# Patient Record
Sex: Female | Born: 1960 | Race: White | Hispanic: No | Marital: Married | Smoking: Never smoker
Health system: Southern US, Community
[De-identification: ages and names within clinical notes are randomized; demographics above are authoritative.]

## PROBLEM LIST (undated history)

## (undated) DIAGNOSIS — J45909 Unspecified asthma, uncomplicated: Secondary | ICD-10-CM

## (undated) DIAGNOSIS — I719 Aortic aneurysm of unspecified site, without rupture: Secondary | ICD-10-CM

## (undated) DIAGNOSIS — G510 Bell's palsy: Secondary | ICD-10-CM

## (undated) DIAGNOSIS — I10 Essential (primary) hypertension: Secondary | ICD-10-CM

## (undated) DIAGNOSIS — R519 Headache, unspecified: Secondary | ICD-10-CM

## (undated) DIAGNOSIS — M25551 Pain in right hip: Secondary | ICD-10-CM

## (undated) DIAGNOSIS — E119 Type 2 diabetes mellitus without complications: Secondary | ICD-10-CM

## (undated) HISTORY — PX: OTHER SURGICAL HISTORY: SHX169

## (undated) HISTORY — PX: ANKLE ARTHROSCOPY: SHX545

## (undated) HISTORY — PX: KNEE ARTHROSCOPY: SHX127

## (undated) HISTORY — PX: ACHILLES TENDON SURGERY: SHX542

## (undated) HISTORY — PX: ABDOMINAL HYSTERECTOMY: SHX81

## (undated) HISTORY — PX: TONSILLECTOMY: SUR1361

## (undated) HISTORY — PX: UPPER GI ENDOSCOPY: SHX6162

---

## 1999-02-23 ENCOUNTER — Ambulatory Visit (HOSPITAL_COMMUNITY): Admission: RE | Admit: 1999-02-23 | Discharge: 1999-02-23 | Payer: Self-pay | Admitting: Oncology

## 2005-09-15 ENCOUNTER — Ambulatory Visit: Payer: Self-pay | Admitting: Family Medicine

## 2006-12-19 ENCOUNTER — Ambulatory Visit: Payer: Self-pay | Admitting: Family Medicine

## 2008-02-04 ENCOUNTER — Ambulatory Visit: Payer: Self-pay | Admitting: Family Medicine

## 2008-04-27 ENCOUNTER — Ambulatory Visit: Payer: Self-pay | Admitting: Orthopaedic Surgery

## 2008-06-03 ENCOUNTER — Other Ambulatory Visit: Payer: Self-pay

## 2008-06-03 ENCOUNTER — Ambulatory Visit: Payer: Self-pay | Admitting: Orthopaedic Surgery

## 2008-06-09 ENCOUNTER — Ambulatory Visit: Payer: Self-pay | Admitting: Orthopaedic Surgery

## 2009-01-27 ENCOUNTER — Ambulatory Visit: Payer: Self-pay | Admitting: Orthopaedic Surgery

## 2009-02-24 ENCOUNTER — Ambulatory Visit: Payer: Self-pay | Admitting: Family Medicine

## 2009-03-02 ENCOUNTER — Ambulatory Visit: Payer: Self-pay | Admitting: Family Medicine

## 2009-03-15 ENCOUNTER — Encounter: Admission: RE | Admit: 2009-03-15 | Discharge: 2009-03-15 | Payer: Self-pay | Admitting: Family Medicine

## 2010-03-01 ENCOUNTER — Encounter: Admission: RE | Admit: 2010-03-01 | Discharge: 2010-03-01 | Payer: Self-pay | Admitting: Family Medicine

## 2010-11-11 ENCOUNTER — Ambulatory Visit: Payer: Self-pay

## 2010-11-22 ENCOUNTER — Ambulatory Visit: Payer: Self-pay

## 2011-03-24 ENCOUNTER — Other Ambulatory Visit: Payer: Self-pay | Admitting: Family Medicine

## 2011-03-24 DIAGNOSIS — Z1231 Encounter for screening mammogram for malignant neoplasm of breast: Secondary | ICD-10-CM

## 2011-03-28 ENCOUNTER — Ambulatory Visit: Payer: Self-pay

## 2011-03-31 ENCOUNTER — Ambulatory Visit
Admission: RE | Admit: 2011-03-31 | Discharge: 2011-03-31 | Disposition: A | Discharge status: Home / Self Care | Payer: PRIVATE HEALTH INSURANCE | Source: Ambulatory Visit | Attending: Family Medicine | Admitting: Family Medicine

## 2011-03-31 DIAGNOSIS — Z1231 Encounter for screening mammogram for malignant neoplasm of breast: Secondary | ICD-10-CM

## 2011-08-21 ENCOUNTER — Ambulatory Visit: Payer: Self-pay | Admitting: Gastroenterology

## 2012-02-20 ENCOUNTER — Other Ambulatory Visit: Payer: Self-pay | Admitting: Family Medicine

## 2012-02-20 DIAGNOSIS — Z1231 Encounter for screening mammogram for malignant neoplasm of breast: Secondary | ICD-10-CM

## 2012-03-26 ENCOUNTER — Ambulatory Visit: Payer: Self-pay | Admitting: Unknown Physician Specialty

## 2012-03-27 ENCOUNTER — Ambulatory Visit: Payer: Self-pay | Admitting: Unknown Physician Specialty

## 2012-04-01 ENCOUNTER — Ambulatory Visit: Payer: PRIVATE HEALTH INSURANCE

## 2012-04-12 ENCOUNTER — Ambulatory Visit
Admission: RE | Admit: 2012-04-12 | Discharge: 2012-04-12 | Disposition: A | Discharge status: Home / Self Care | Payer: BC Managed Care – PPO | Source: Ambulatory Visit | Attending: Family Medicine | Admitting: Family Medicine

## 2012-04-12 DIAGNOSIS — Z1231 Encounter for screening mammogram for malignant neoplasm of breast: Secondary | ICD-10-CM

## 2012-04-27 ENCOUNTER — Ambulatory Visit: Payer: Self-pay | Admitting: Unknown Physician Specialty

## 2013-02-10 ENCOUNTER — Ambulatory Visit: Payer: Self-pay | Admitting: Orthopaedic Surgery

## 2013-03-10 ENCOUNTER — Ambulatory Visit: Payer: Self-pay | Admitting: Gastroenterology

## 2013-03-24 ENCOUNTER — Ambulatory Visit: Payer: Self-pay | Admitting: Gastroenterology

## 2013-03-26 LAB — PATHOLOGY REPORT

## 2013-06-09 ENCOUNTER — Other Ambulatory Visit: Payer: Self-pay

## 2013-06-09 DIAGNOSIS — Z1231 Encounter for screening mammogram for malignant neoplasm of breast: Secondary | ICD-10-CM

## 2013-07-08 ENCOUNTER — Ambulatory Visit
Admission: RE | Admit: 2013-07-08 | Discharge: 2013-07-08 | Disposition: A | Discharge status: Home / Self Care | Payer: No Typology Code available for payment source | Source: Ambulatory Visit

## 2013-07-08 DIAGNOSIS — Z1231 Encounter for screening mammogram for malignant neoplasm of breast: Secondary | ICD-10-CM

## 2014-07-29 ENCOUNTER — Other Ambulatory Visit: Payer: Self-pay

## 2014-07-29 DIAGNOSIS — Z1231 Encounter for screening mammogram for malignant neoplasm of breast: Secondary | ICD-10-CM

## 2014-08-10 ENCOUNTER — Encounter (INDEPENDENT_AMBULATORY_CARE_PROVIDER_SITE_OTHER): Payer: Self-pay

## 2014-08-10 ENCOUNTER — Ambulatory Visit
Admission: RE | Admit: 2014-08-10 | Discharge: 2014-08-10 | Disposition: A | Discharge status: Home / Self Care | Payer: No Typology Code available for payment source | Source: Ambulatory Visit

## 2014-08-10 DIAGNOSIS — Z1231 Encounter for screening mammogram for malignant neoplasm of breast: Secondary | ICD-10-CM

## 2014-08-12 ENCOUNTER — Other Ambulatory Visit: Payer: Self-pay | Admitting: Family Medicine

## 2014-08-12 DIAGNOSIS — R928 Other abnormal and inconclusive findings on diagnostic imaging of breast: Secondary | ICD-10-CM

## 2014-08-19 ENCOUNTER — Ambulatory Visit
Admission: RE | Admit: 2014-08-19 | Discharge: 2014-08-19 | Disposition: A | Discharge status: Home / Self Care | Payer: No Typology Code available for payment source | Source: Ambulatory Visit | Attending: Family Medicine | Admitting: Family Medicine

## 2014-08-19 DIAGNOSIS — R928 Other abnormal and inconclusive findings on diagnostic imaging of breast: Secondary | ICD-10-CM

## 2015-05-13 ENCOUNTER — Emergency Department: Payer: No Typology Code available for payment source

## 2015-05-13 ENCOUNTER — Encounter: Payer: Self-pay | Admitting: Emergency Medicine

## 2015-05-13 ENCOUNTER — Other Ambulatory Visit: Payer: Self-pay

## 2015-05-13 ENCOUNTER — Emergency Department
Admission: EM | Admit: 2015-05-13 | Discharge: 2015-05-13 | Disposition: A | Discharge status: Home / Self Care | Payer: No Typology Code available for payment source | Attending: Emergency Medicine | Admitting: Emergency Medicine

## 2015-05-13 DIAGNOSIS — Z87891 Personal history of nicotine dependence: Secondary | ICD-10-CM | POA: Insufficient documentation

## 2015-05-13 DIAGNOSIS — M7981 Nontraumatic hematoma of soft tissue: Secondary | ICD-10-CM | POA: Insufficient documentation

## 2015-05-13 DIAGNOSIS — M79601 Pain in right arm: Secondary | ICD-10-CM | POA: Diagnosis present

## 2015-05-13 DIAGNOSIS — I10 Essential (primary) hypertension: Secondary | ICD-10-CM | POA: Diagnosis not present

## 2015-05-13 DIAGNOSIS — E119 Type 2 diabetes mellitus without complications: Secondary | ICD-10-CM | POA: Diagnosis not present

## 2015-05-13 DIAGNOSIS — M79603 Pain in arm, unspecified: Secondary | ICD-10-CM

## 2015-05-13 HISTORY — DX: Type 2 diabetes mellitus without complications: E11.9

## 2015-05-13 HISTORY — DX: Essential (primary) hypertension: I10

## 2015-05-13 LAB — URINALYSIS COMPLETE WITH MICROSCOPIC (ARMC ONLY)
BACTERIA UA: NONE SEEN
Bilirubin Urine: NEGATIVE
Glucose, UA: NEGATIVE mg/dL
KETONES UR: NEGATIVE mg/dL
Leukocytes, UA: NEGATIVE
Nitrite: NEGATIVE
PH: 8 (ref 5.0–8.0)
PROTEIN: NEGATIVE mg/dL
Specific Gravity, Urine: 1.005 (ref 1.005–1.030)
WBC, UA: NONE SEEN WBC/hpf (ref 0–5)

## 2015-05-13 LAB — COMPREHENSIVE METABOLIC PANEL
ALBUMIN: 4.2 g/dL (ref 3.5–5.0)
ALK PHOS: 84 U/L (ref 38–126)
ALT: 29 U/L (ref 14–54)
ANION GAP: 7 (ref 5–15)
AST: 25 U/L (ref 15–41)
BILIRUBIN TOTAL: 0.5 mg/dL (ref 0.3–1.2)
BUN: 12 mg/dL (ref 6–20)
CHLORIDE: 104 mmol/L (ref 101–111)
CO2: 29 mmol/L (ref 22–32)
Calcium: 9.4 mg/dL (ref 8.9–10.3)
Creatinine, Ser: 0.76 mg/dL (ref 0.44–1.00)
GFR calc non Af Amer: >60 mL/min (ref >60)
Glucose, Bld: 111 mg/dL — ABNORMAL HIGH (ref 65–99)
POTASSIUM: 3.4 mmol/L — AB (ref 3.5–5.1)
Sodium: 140 mmol/L (ref 135–145)
Total Protein: 7.9 g/dL (ref 6.5–8.1)

## 2015-05-13 LAB — CBC
HEMATOCRIT: 44.1 % (ref 35.0–47.0)
Hemoglobin: 15.2 g/dL (ref 12.0–16.0)
MCH: 30.9 pg (ref 26.0–34.0)
MCHC: 34.5 g/dL (ref 32.0–36.0)
MCV: 89.5 fL (ref 80.0–100.0)
PLATELETS: 360 10*3/uL (ref 150–440)
RBC: 4.92 MIL/uL (ref 3.80–5.20)
RDW: 13.4 % (ref 11.5–14.5)
WBC: 8 10*3/uL (ref 3.6–11.0)

## 2015-05-13 LAB — PROTIME-INR
INR: 0.94
Prothrombin Time: 12.8 seconds (ref 11.4–15.0)

## 2015-05-13 LAB — TROPONIN I

## 2015-05-13 NOTE — ED Provider Notes (Signed)
[facility] Emergency Department Provider Note  ____________________________________________  Time seen: On arrival  I have reviewed the triage vital signs and the nursing notes.   HISTORY  Chief Complaint Back Pain and Arm Pain      HPI Rachel Mueller is a 54 y.o. female patient presents with complaints of right arm pain that started in her mid medial humerus area and has now extended into her forearm. She noticed a bruise in her right antecubital fossa and does not recall any injury. She is concerned because her father had a blood clot in his arm. She is not on any blood thinners. She has no numbness or tingling. She has no headache. She saw urgent care recently and they thought it may be related to shingles but she has no rash. She has no swelling of her arm     Past Medical History  Diagnosis Date  . Hypertension   . Diabetes mellitus without complication     There are no active problems to display for this patient.   Past Surgical History  Procedure Laterality Date  . Abdominal hysterectomy    . Tonsillectomy    . Knee arthroscopy    . Ankle arthroscopy    . Left foot surgery    . Right shoulder surgery    . Achilies surgery      No current outpatient prescriptions on file.  Allergies Review of patient's allergies indicates no known allergies.  No family history on file.  Social History History  Substance Use Topics  . Smoking status: Former Research scientist (life sciences)  . Smokeless tobacco: Not on file  . Alcohol Use: Yes     Comment: occasional    Review of Systems  Constitutional: Negative for fever. Eyes: Negative for visual changes. ENT: Negative for sore throat Cardiovascular: Negative for chest pain. Respiratory: Negative for shortness of breath. Gastrointestinal: Negative for abdominal pain, vomiting and diarrhea. Genitourinary: Negative for dysuria. Musculoskeletal: Negative for back pain. Also for arm pain Skin: Negative  for rash. Positive for bruise Neurological: Negative for headaches or focal weakness   10-point ROS otherwise negative.  ____________________________________________   PHYSICAL EXAM:  VITAL SIGNS: ED Triage Vitals  Enc Vitals Group     BP 05/13/15 1133 149/97 mmHg     Pulse Rate 05/13/15 1133 101     Resp 05/13/15 1133 18     Temp 05/13/15 1133 98.2 F (36.8 C)     Temp Source 05/13/15 1133 Oral     SpO2 05/13/15 1133 100 %     Weight 05/13/15 1133 192 lb (87.091 kg)     Height 05/13/15 1133 5\' 2"  (1.575 m)     Head Cir --      Peak Flow --      Pain Score 05/13/15 1134 1     Pain Loc --      Pain Edu? --      Excl. in [place]? --      Constitutional: Alert and oriented. Well appearing and in no distress. Pleasant and interactive  ENT   Head: Normocephalic and atraumatic.   Cardiovascular: Normal rate, regular rhythm. Normal and symmetric distal pulses are present in all extremities. No murmurs, rubs, or gallops. Respiratory: Normal respiratory effort without tachypnea nor retractions. Breath sounds are clear and equal bilaterally.  Gastrointestinal: Soft and non-tender in all quadrants. No distention. There is no CVA tenderness. Genitourinary: deferred Musculoskeletal: Nontender with normal range of motion in all extremities. No lower extremity tenderness  nor edema. 2+ radial and ulnar pulse in right arm. Normal cap refill. Extremity is warm and well perfused. There is an approximately 2 x 2 centimeter bruise in her right antecubital fossa. Neurologic:  Normal speech and language. No gross focal neurologic deficits are appreciated. Skin:  Skin is warm, dry and intact. No rash noted. Psychiatric: Mood and affect are normal. Patient exhibits appropriate insight and judgment.  ____________________________________________    LABS (pertinent positives/negatives)  Labs Reviewed  COMPREHENSIVE METABOLIC PANEL - Abnormal; Notable for the following:    Potassium 3.4 (*)     Glucose, Bld 111 (*)    All other components within normal limits  URINALYSIS COMPLETEWITH MICROSCOPIC ([facility] ONLY) - Abnormal; Notable for the following:    Color, Urine STRAW (*)    APPearance CLEAR (*)    Hgb urine dipstick 1+ (*)    Squamous Epithelial / LPF 0-5 (*)    All other components within normal limits  CBC  PROTIME-INR  TROPONIN I    ____________________________________________   EKG  ED ECG REPORT I, Lavonia Drafts, the attending physician, personally viewed and interpreted this ECG.  Date: 05/13/2015 EKG Time: 11:55 AM Rate: 93 Rhythm: normal sinus rhythm QRS Axis: normal Intervals: normal ST/T Wave abnormalities: Nonspecific Conduction Disutrbances: none Narrative Interpretation: unremarkable   ____________________________________________    RADIOLOGY  Right upper extremity ultrasound shows no blood clot  ____________________________________________   PROCEDURES  Procedure(s) performed: none  Critical Care performed: none  ____________________________________________   INITIAL IMPRESSION / ASSESSMENT AND PLAN / ED COURSE  Pertinent labs & imaging results that were available during my care of the patient were reviewed by me and considered in my medical decision making (see chart for details).  Patient overall well-appearing and relatively benign exam. Given given her history and to reassure patient we will obtain ultrasound of right upper extremity to rule out DVT.  ____________________________________________   FINAL CLINICAL IMPRESSION(S) / ED DIAGNOSES  Final diagnoses:  Upper extremity pain     Lavonia Drafts, MD 05/13/15 1551

## 2015-05-13 NOTE — ED Notes (Signed)
Patient transported to Ultrasound 

## 2015-05-13 NOTE — Discharge Instructions (Signed)

## 2015-05-13 NOTE — ED Notes (Signed)
Pt states she started having right scapula pain that began on Saturday, was seen at acute care on Monday, tx with muscle relaxer, went back after she noticed a bruise on her right arm, Dr thought she might be dealing with shingles, today pt noticed right first digit numbness, pain up right arm and swelling, and states she has bruising all over her arm, pain remains in her right upper back. Concerned bc her father died from a blood clot to his heart.

## 2015-09-16 ENCOUNTER — Other Ambulatory Visit: Payer: Self-pay

## 2015-09-16 DIAGNOSIS — Z1231 Encounter for screening mammogram for malignant neoplasm of breast: Secondary | ICD-10-CM

## 2015-11-11 ENCOUNTER — Ambulatory Visit
Admission: RE | Admit: 2015-11-11 | Discharge: 2015-11-11 | Disposition: A | Discharge status: Home / Self Care | Payer: No Typology Code available for payment source | Source: Ambulatory Visit

## 2015-11-11 DIAGNOSIS — Z1231 Encounter for screening mammogram for malignant neoplasm of breast: Secondary | ICD-10-CM

## 2016-12-07 ENCOUNTER — Other Ambulatory Visit: Payer: Self-pay | Admitting: Family Medicine

## 2016-12-07 DIAGNOSIS — Z1231 Encounter for screening mammogram for malignant neoplasm of breast: Secondary | ICD-10-CM

## 2016-12-15 DIAGNOSIS — G518 Other disorders of facial nerve: Secondary | ICD-10-CM | POA: Diagnosis not present

## 2016-12-21 ENCOUNTER — Ambulatory Visit: Payer: No Typology Code available for payment source

## 2017-01-01 DIAGNOSIS — E1165 Type 2 diabetes mellitus with hyperglycemia: Secondary | ICD-10-CM | POA: Diagnosis not present

## 2017-01-08 DIAGNOSIS — J019 Acute sinusitis, unspecified: Secondary | ICD-10-CM | POA: Diagnosis not present

## 2017-01-15 ENCOUNTER — Ambulatory Visit
Admission: RE | Admit: 2017-01-15 | Discharge: 2017-01-15 | Disposition: A | Discharge status: Home / Self Care | Payer: 59 | Source: Ambulatory Visit | Attending: Family Medicine | Admitting: Family Medicine

## 2017-01-15 DIAGNOSIS — Z1231 Encounter for screening mammogram for malignant neoplasm of breast: Secondary | ICD-10-CM | POA: Diagnosis not present

## 2017-01-17 DIAGNOSIS — M62830 Muscle spasm of back: Secondary | ICD-10-CM | POA: Diagnosis not present

## 2017-01-17 DIAGNOSIS — R6889 Other general symptoms and signs: Secondary | ICD-10-CM | POA: Diagnosis not present

## 2017-01-17 DIAGNOSIS — J069 Acute upper respiratory infection, unspecified: Secondary | ICD-10-CM | POA: Diagnosis not present

## 2017-02-06 DIAGNOSIS — J069 Acute upper respiratory infection, unspecified: Secondary | ICD-10-CM | POA: Diagnosis not present

## 2017-02-21 DIAGNOSIS — E785 Hyperlipidemia, unspecified: Secondary | ICD-10-CM | POA: Diagnosis not present

## 2017-03-22 DIAGNOSIS — D485 Neoplasm of uncertain behavior of skin: Secondary | ICD-10-CM | POA: Diagnosis not present

## 2017-03-22 DIAGNOSIS — D229 Melanocytic nevi, unspecified: Secondary | ICD-10-CM | POA: Diagnosis not present

## 2017-03-22 DIAGNOSIS — Z1283 Encounter for screening for malignant neoplasm of skin: Secondary | ICD-10-CM | POA: Diagnosis not present

## 2017-05-29 DIAGNOSIS — E119 Type 2 diabetes mellitus without complications: Secondary | ICD-10-CM | POA: Diagnosis not present

## 2017-06-04 DIAGNOSIS — E119 Type 2 diabetes mellitus without complications: Secondary | ICD-10-CM | POA: Diagnosis not present

## 2017-06-04 DIAGNOSIS — I10 Essential (primary) hypertension: Secondary | ICD-10-CM | POA: Diagnosis not present

## 2017-06-04 DIAGNOSIS — E782 Mixed hyperlipidemia: Secondary | ICD-10-CM | POA: Diagnosis not present

## 2017-06-27 DIAGNOSIS — M25511 Pain in right shoulder: Secondary | ICD-10-CM | POA: Diagnosis not present

## 2017-06-27 DIAGNOSIS — M545 Low back pain: Secondary | ICD-10-CM | POA: Diagnosis not present

## 2017-09-13 DIAGNOSIS — Z23 Encounter for immunization: Secondary | ICD-10-CM | POA: Diagnosis not present

## 2017-09-28 DIAGNOSIS — J069 Acute upper respiratory infection, unspecified: Secondary | ICD-10-CM | POA: Diagnosis not present

## 2017-09-28 DIAGNOSIS — J019 Acute sinusitis, unspecified: Secondary | ICD-10-CM | POA: Diagnosis not present

## 2017-09-28 DIAGNOSIS — B9689 Other specified bacterial agents as the cause of diseases classified elsewhere: Secondary | ICD-10-CM | POA: Diagnosis not present

## 2017-10-29 DIAGNOSIS — I10 Essential (primary) hypertension: Secondary | ICD-10-CM | POA: Diagnosis not present

## 2017-10-29 DIAGNOSIS — E119 Type 2 diabetes mellitus without complications: Secondary | ICD-10-CM | POA: Diagnosis not present

## 2017-10-31 DIAGNOSIS — E119 Type 2 diabetes mellitus without complications: Secondary | ICD-10-CM | POA: Diagnosis not present

## 2017-10-31 DIAGNOSIS — E782 Mixed hyperlipidemia: Secondary | ICD-10-CM | POA: Diagnosis not present

## 2017-10-31 DIAGNOSIS — I10 Essential (primary) hypertension: Secondary | ICD-10-CM | POA: Diagnosis not present

## 2017-11-15 DIAGNOSIS — B370 Candidal stomatitis: Secondary | ICD-10-CM | POA: Diagnosis not present

## 2017-11-15 DIAGNOSIS — J029 Acute pharyngitis, unspecified: Secondary | ICD-10-CM | POA: Diagnosis not present

## 2017-12-03 ENCOUNTER — Other Ambulatory Visit: Payer: Self-pay | Admitting: Family Medicine

## 2017-12-03 DIAGNOSIS — Z1231 Encounter for screening mammogram for malignant neoplasm of breast: Secondary | ICD-10-CM

## 2018-01-10 DIAGNOSIS — R112 Nausea with vomiting, unspecified: Secondary | ICD-10-CM | POA: Diagnosis not present

## 2018-01-10 DIAGNOSIS — K5900 Constipation, unspecified: Secondary | ICD-10-CM | POA: Diagnosis not present

## 2018-01-16 DIAGNOSIS — R509 Fever, unspecified: Secondary | ICD-10-CM | POA: Diagnosis not present

## 2018-01-16 DIAGNOSIS — R69 Illness, unspecified: Secondary | ICD-10-CM | POA: Diagnosis not present

## 2018-01-29 ENCOUNTER — Ambulatory Visit: Payer: 59

## 2018-01-30 DIAGNOSIS — E119 Type 2 diabetes mellitus without complications: Secondary | ICD-10-CM | POA: Diagnosis not present

## 2018-02-06 DIAGNOSIS — I10 Essential (primary) hypertension: Secondary | ICD-10-CM | POA: Diagnosis not present

## 2018-02-06 DIAGNOSIS — E119 Type 2 diabetes mellitus without complications: Secondary | ICD-10-CM | POA: Diagnosis not present

## 2018-02-06 DIAGNOSIS — E782 Mixed hyperlipidemia: Secondary | ICD-10-CM | POA: Diagnosis not present

## 2018-02-18 DIAGNOSIS — J029 Acute pharyngitis, unspecified: Secondary | ICD-10-CM | POA: Diagnosis not present

## 2018-03-03 DIAGNOSIS — J209 Acute bronchitis, unspecified: Secondary | ICD-10-CM | POA: Diagnosis not present

## 2018-03-03 DIAGNOSIS — B373 Candidiasis of vulva and vagina: Secondary | ICD-10-CM | POA: Diagnosis not present

## 2018-03-03 DIAGNOSIS — J019 Acute sinusitis, unspecified: Secondary | ICD-10-CM | POA: Diagnosis not present

## 2018-03-05 ENCOUNTER — Ambulatory Visit
Admission: RE | Admit: 2018-03-05 | Discharge: 2018-03-05 | Disposition: A | Discharge status: Home / Self Care | Payer: 59 | Source: Ambulatory Visit | Attending: Family Medicine | Admitting: Family Medicine

## 2018-03-05 DIAGNOSIS — Z1231 Encounter for screening mammogram for malignant neoplasm of breast: Secondary | ICD-10-CM

## 2018-03-06 DIAGNOSIS — M25572 Pain in left ankle and joints of left foot: Secondary | ICD-10-CM | POA: Diagnosis not present

## 2018-03-20 DIAGNOSIS — I10 Essential (primary) hypertension: Secondary | ICD-10-CM | POA: Diagnosis not present

## 2018-03-20 DIAGNOSIS — Z8249 Family history of ischemic heart disease and other diseases of the circulatory system: Secondary | ICD-10-CM | POA: Diagnosis not present

## 2018-03-26 DIAGNOSIS — N951 Menopausal and female climacteric states: Secondary | ICD-10-CM | POA: Diagnosis not present

## 2018-03-26 DIAGNOSIS — R635 Abnormal weight gain: Secondary | ICD-10-CM | POA: Diagnosis not present

## 2018-03-28 DIAGNOSIS — I10 Essential (primary) hypertension: Secondary | ICD-10-CM | POA: Diagnosis not present

## 2018-03-28 DIAGNOSIS — E119 Type 2 diabetes mellitus without complications: Secondary | ICD-10-CM | POA: Diagnosis not present

## 2018-04-01 DIAGNOSIS — Z8249 Family history of ischemic heart disease and other diseases of the circulatory system: Secondary | ICD-10-CM | POA: Diagnosis not present

## 2018-04-04 DIAGNOSIS — I10 Essential (primary) hypertension: Secondary | ICD-10-CM | POA: Diagnosis not present

## 2018-04-29 DIAGNOSIS — M25572 Pain in left ankle and joints of left foot: Secondary | ICD-10-CM | POA: Diagnosis not present

## 2018-04-30 DIAGNOSIS — D485 Neoplasm of uncertain behavior of skin: Secondary | ICD-10-CM | POA: Diagnosis not present

## 2018-04-30 DIAGNOSIS — L578 Other skin changes due to chronic exposure to nonionizing radiation: Secondary | ICD-10-CM | POA: Diagnosis not present

## 2018-04-30 DIAGNOSIS — B372 Candidiasis of skin and nail: Secondary | ICD-10-CM | POA: Diagnosis not present

## 2018-05-06 DIAGNOSIS — E785 Hyperlipidemia, unspecified: Secondary | ICD-10-CM | POA: Diagnosis not present

## 2018-05-06 DIAGNOSIS — D649 Anemia, unspecified: Secondary | ICD-10-CM | POA: Diagnosis not present

## 2018-05-06 DIAGNOSIS — E119 Type 2 diabetes mellitus without complications: Secondary | ICD-10-CM | POA: Diagnosis not present

## 2018-05-08 DIAGNOSIS — M19072 Primary osteoarthritis, left ankle and foot: Secondary | ICD-10-CM | POA: Diagnosis not present

## 2018-06-04 DIAGNOSIS — E119 Type 2 diabetes mellitus without complications: Secondary | ICD-10-CM | POA: Diagnosis not present

## 2018-06-04 DIAGNOSIS — E782 Mixed hyperlipidemia: Secondary | ICD-10-CM | POA: Diagnosis not present

## 2018-06-04 DIAGNOSIS — I10 Essential (primary) hypertension: Secondary | ICD-10-CM | POA: Diagnosis not present

## 2018-06-05 DIAGNOSIS — E113393 Type 2 diabetes mellitus with moderate nonproliferative diabetic retinopathy without macular edema, bilateral: Secondary | ICD-10-CM | POA: Diagnosis not present

## 2018-07-17 DIAGNOSIS — E7800 Pure hypercholesterolemia, unspecified: Secondary | ICD-10-CM | POA: Diagnosis not present

## 2018-07-17 DIAGNOSIS — I712 Thoracic aortic aneurysm, without rupture: Secondary | ICD-10-CM | POA: Diagnosis not present

## 2018-07-17 DIAGNOSIS — I10 Essential (primary) hypertension: Secondary | ICD-10-CM | POA: Diagnosis not present

## 2018-09-05 DIAGNOSIS — Z01419 Encounter for gynecological examination (general) (routine) without abnormal findings: Secondary | ICD-10-CM | POA: Diagnosis not present

## 2018-09-05 DIAGNOSIS — Z6833 Body mass index (BMI) 33.0-33.9, adult: Secondary | ICD-10-CM | POA: Diagnosis not present

## 2018-09-05 DIAGNOSIS — N951 Menopausal and female climacteric states: Secondary | ICD-10-CM | POA: Diagnosis not present

## 2018-09-09 DIAGNOSIS — E119 Type 2 diabetes mellitus without complications: Secondary | ICD-10-CM | POA: Diagnosis not present

## 2018-09-09 DIAGNOSIS — E039 Hypothyroidism, unspecified: Secondary | ICD-10-CM | POA: Diagnosis not present

## 2018-09-10 DIAGNOSIS — D485 Neoplasm of uncertain behavior of skin: Secondary | ICD-10-CM | POA: Diagnosis not present

## 2018-09-10 DIAGNOSIS — D2262 Melanocytic nevi of left upper limb, including shoulder: Secondary | ICD-10-CM | POA: Diagnosis not present

## 2018-09-16 DIAGNOSIS — E782 Mixed hyperlipidemia: Secondary | ICD-10-CM | POA: Diagnosis not present

## 2018-09-16 DIAGNOSIS — I10 Essential (primary) hypertension: Secondary | ICD-10-CM | POA: Diagnosis not present

## 2018-09-16 DIAGNOSIS — E119 Type 2 diabetes mellitus without complications: Secondary | ICD-10-CM | POA: Diagnosis not present

## 2018-10-31 DIAGNOSIS — M62838 Other muscle spasm: Secondary | ICD-10-CM | POA: Diagnosis not present

## 2018-10-31 DIAGNOSIS — M542 Cervicalgia: Secondary | ICD-10-CM | POA: Diagnosis not present

## 2018-11-08 DIAGNOSIS — M25512 Pain in left shoulder: Secondary | ICD-10-CM | POA: Diagnosis not present

## 2018-11-08 DIAGNOSIS — M542 Cervicalgia: Secondary | ICD-10-CM | POA: Diagnosis not present

## 2018-11-11 DIAGNOSIS — M542 Cervicalgia: Secondary | ICD-10-CM | POA: Diagnosis not present

## 2018-11-11 DIAGNOSIS — M25512 Pain in left shoulder: Secondary | ICD-10-CM | POA: Diagnosis not present

## 2018-11-14 DIAGNOSIS — M25512 Pain in left shoulder: Secondary | ICD-10-CM | POA: Diagnosis not present

## 2018-11-14 DIAGNOSIS — M542 Cervicalgia: Secondary | ICD-10-CM | POA: Diagnosis not present

## 2018-11-19 DIAGNOSIS — M25512 Pain in left shoulder: Secondary | ICD-10-CM | POA: Diagnosis not present

## 2018-11-19 DIAGNOSIS — M542 Cervicalgia: Secondary | ICD-10-CM | POA: Diagnosis not present

## 2018-11-22 DIAGNOSIS — M542 Cervicalgia: Secondary | ICD-10-CM | POA: Diagnosis not present

## 2018-11-22 DIAGNOSIS — M25512 Pain in left shoulder: Secondary | ICD-10-CM | POA: Diagnosis not present

## 2018-11-28 DIAGNOSIS — M25512 Pain in left shoulder: Secondary | ICD-10-CM | POA: Diagnosis not present

## 2018-11-28 DIAGNOSIS — M542 Cervicalgia: Secondary | ICD-10-CM | POA: Diagnosis not present

## 2018-12-04 DIAGNOSIS — Z23 Encounter for immunization: Secondary | ICD-10-CM | POA: Diagnosis not present

## 2018-12-04 DIAGNOSIS — I10 Essential (primary) hypertension: Secondary | ICD-10-CM | POA: Diagnosis not present

## 2018-12-10 DIAGNOSIS — D485 Neoplasm of uncertain behavior of skin: Secondary | ICD-10-CM | POA: Diagnosis not present

## 2018-12-10 DIAGNOSIS — D2262 Melanocytic nevi of left upper limb, including shoulder: Secondary | ICD-10-CM | POA: Diagnosis not present

## 2019-01-14 DIAGNOSIS — M545 Low back pain: Secondary | ICD-10-CM | POA: Diagnosis not present

## 2019-01-14 DIAGNOSIS — M79641 Pain in right hand: Secondary | ICD-10-CM | POA: Diagnosis not present

## 2019-05-14 ENCOUNTER — Other Ambulatory Visit: Payer: Self-pay | Admitting: Family Medicine

## 2019-05-14 DIAGNOSIS — Z1231 Encounter for screening mammogram for malignant neoplasm of breast: Secondary | ICD-10-CM

## 2019-07-02 ENCOUNTER — Ambulatory Visit: Payer: 59

## 2019-07-14 ENCOUNTER — Other Ambulatory Visit: Payer: Self-pay

## 2019-07-14 ENCOUNTER — Ambulatory Visit
Admission: RE | Admit: 2019-07-14 | Discharge: 2019-07-14 | Disposition: A | Discharge status: Home / Self Care | Payer: BC Managed Care – PPO | Source: Ambulatory Visit | Attending: Family Medicine | Admitting: Family Medicine

## 2019-07-14 DIAGNOSIS — Z1231 Encounter for screening mammogram for malignant neoplasm of breast: Secondary | ICD-10-CM

## 2020-07-19 ENCOUNTER — Other Ambulatory Visit: Payer: Self-pay | Admitting: Family Medicine

## 2020-07-19 DIAGNOSIS — Z1231 Encounter for screening mammogram for malignant neoplasm of breast: Secondary | ICD-10-CM

## 2020-08-04 ENCOUNTER — Other Ambulatory Visit: Payer: Self-pay

## 2020-08-04 ENCOUNTER — Ambulatory Visit
Admission: RE | Admit: 2020-08-04 | Discharge: 2020-08-04 | Disposition: A | Discharge status: Home / Self Care | Payer: BC Managed Care – PPO | Source: Ambulatory Visit

## 2020-08-04 DIAGNOSIS — Z1231 Encounter for screening mammogram for malignant neoplasm of breast: Secondary | ICD-10-CM

## 2020-08-06 ENCOUNTER — Other Ambulatory Visit: Payer: Self-pay | Admitting: Family Medicine

## 2020-08-06 DIAGNOSIS — R928 Other abnormal and inconclusive findings on diagnostic imaging of breast: Secondary | ICD-10-CM

## 2020-08-18 ENCOUNTER — Ambulatory Visit
Admission: RE | Admit: 2020-08-18 | Discharge: 2020-08-18 | Disposition: A | Discharge status: Home / Self Care | Payer: BC Managed Care – PPO | Source: Ambulatory Visit | Attending: Family Medicine | Admitting: Family Medicine

## 2020-08-18 ENCOUNTER — Other Ambulatory Visit: Payer: Self-pay | Admitting: Family Medicine

## 2020-08-18 ENCOUNTER — Other Ambulatory Visit: Payer: Self-pay

## 2020-08-18 DIAGNOSIS — R928 Other abnormal and inconclusive findings on diagnostic imaging of breast: Secondary | ICD-10-CM

## 2020-08-18 DIAGNOSIS — R921 Mammographic calcification found on diagnostic imaging of breast: Secondary | ICD-10-CM

## 2020-12-28 ENCOUNTER — Ambulatory Visit: Payer: BC Managed Care – PPO | Attending: Internal Medicine

## 2020-12-28 ENCOUNTER — Other Ambulatory Visit: Payer: Self-pay | Admitting: Internal Medicine

## 2020-12-28 DIAGNOSIS — Z23 Encounter for immunization: Secondary | ICD-10-CM

## 2020-12-28 NOTE — Progress Notes (Signed)
   Covid-19 Vaccination Clinic  Name:  Rachel Mueller    MRN: [id number] DOB: 22-Aug-1961  12/28/2020  Ms. Rachel Mueller was observed post Covid-19 immunization for 15 minutes without incident. She was provided with Vaccine Information Sheet and instruction to access the V-Safe system.   Ms. Rachel Mueller was instructed to call 911 with any severe reactions post vaccine: Marland Kitchen Difficulty breathing  . Swelling of face and throat  . A fast heartbeat  . A bad rash all over body  . Dizziness and weakness   Immunizations Administered    Name Date Dose VIS Date Route   PFIZER Comrnaty(Gray TOP) Covid-19 Vaccine 12/28/2020  1:25 PM 0.3 mL 11/04/2020 Intramuscular   Manufacturer: [employer]   Lot: [id number]   NDC: [phone]

## 2021-03-07 ENCOUNTER — Ambulatory Visit
Admission: RE | Admit: 2021-03-07 | Discharge: 2021-03-07 | Disposition: A | Discharge status: Home / Self Care | Payer: BC Managed Care – PPO | Source: Ambulatory Visit | Attending: Family Medicine | Admitting: Family Medicine

## 2021-03-07 ENCOUNTER — Other Ambulatory Visit: Payer: Self-pay

## 2021-03-07 ENCOUNTER — Other Ambulatory Visit: Payer: Self-pay | Admitting: Family Medicine

## 2021-03-07 DIAGNOSIS — R921 Mammographic calcification found on diagnostic imaging of breast: Secondary | ICD-10-CM

## 2021-03-14 ENCOUNTER — Ambulatory Visit
Admission: RE | Admit: 2021-03-14 | Discharge: 2021-03-14 | Disposition: A | Discharge status: Home / Self Care | Payer: BC Managed Care – PPO | Source: Ambulatory Visit | Attending: Family Medicine | Admitting: Family Medicine

## 2021-03-14 ENCOUNTER — Other Ambulatory Visit: Payer: Self-pay | Admitting: Family Medicine

## 2021-03-14 ENCOUNTER — Other Ambulatory Visit: Payer: Self-pay

## 2021-03-14 DIAGNOSIS — R921 Mammographic calcification found on diagnostic imaging of breast: Secondary | ICD-10-CM

## 2021-03-14 HISTORY — PX: BREAST BIOPSY: SHX20

## 2021-08-10 ENCOUNTER — Other Ambulatory Visit: Payer: Self-pay | Admitting: Family Medicine

## 2021-08-10 DIAGNOSIS — Z1231 Encounter for screening mammogram for malignant neoplasm of breast: Secondary | ICD-10-CM

## 2021-09-12 ENCOUNTER — Other Ambulatory Visit: Payer: Self-pay

## 2021-09-12 ENCOUNTER — Ambulatory Visit
Admission: RE | Admit: 2021-09-12 | Discharge: 2021-09-12 | Disposition: A | Discharge status: Home / Self Care | Payer: BC Managed Care – PPO | Source: Ambulatory Visit | Attending: Family Medicine | Admitting: Family Medicine

## 2021-09-12 DIAGNOSIS — Z1231 Encounter for screening mammogram for malignant neoplasm of breast: Secondary | ICD-10-CM

## 2022-01-14 IMAGING — MG MM DIGITAL SCREENING BILAT W/ TOMO AND CAD
6 of 10 series · 6 of 30 positions shown · non-contrast
Comparison: Previous exam(s).

CLINICAL DATA: Screening.

EXAM:
DIGITAL SCREENING BILATERAL MAMMOGRAM WITH TOMOSYNTHESIS AND CAD
TECHNIQUE: Bilateral screening digital craniocaudal and mediolateral oblique
mammograms were obtained. Bilateral screening digital breast
tomosynthesis was performed. The images were evaluated with
computer-aided detection.

[R MLO synth-2D]
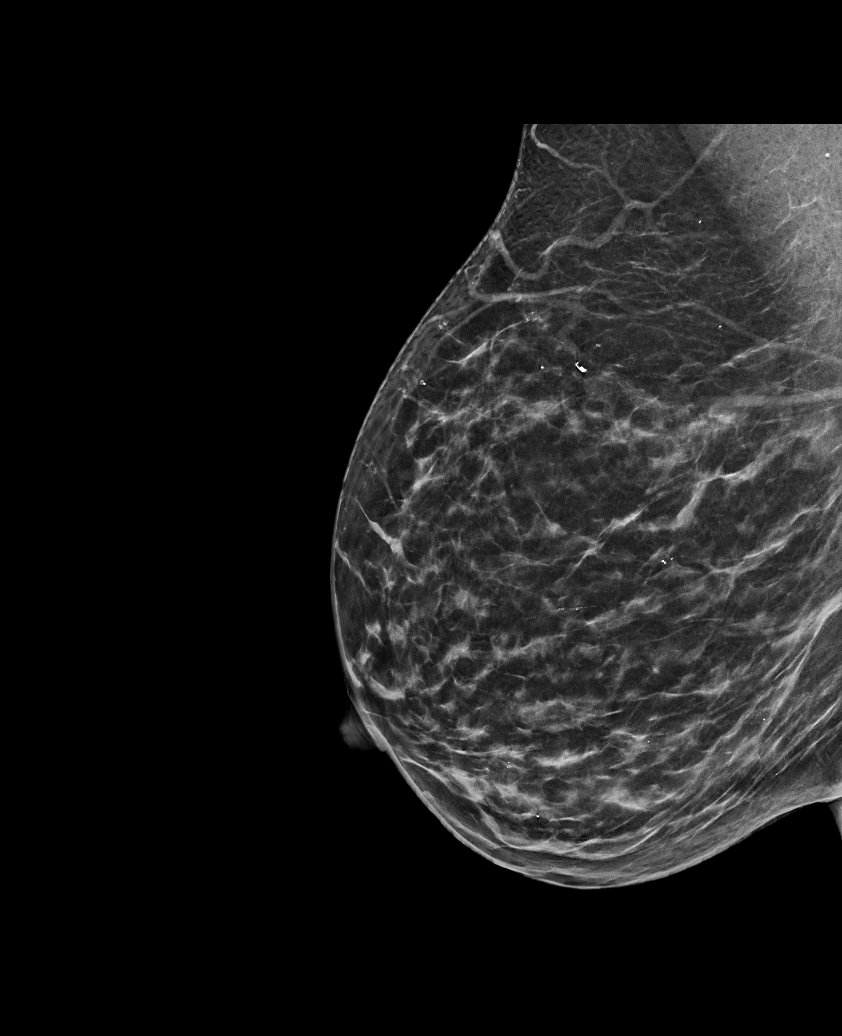

[R CC synth-2D]
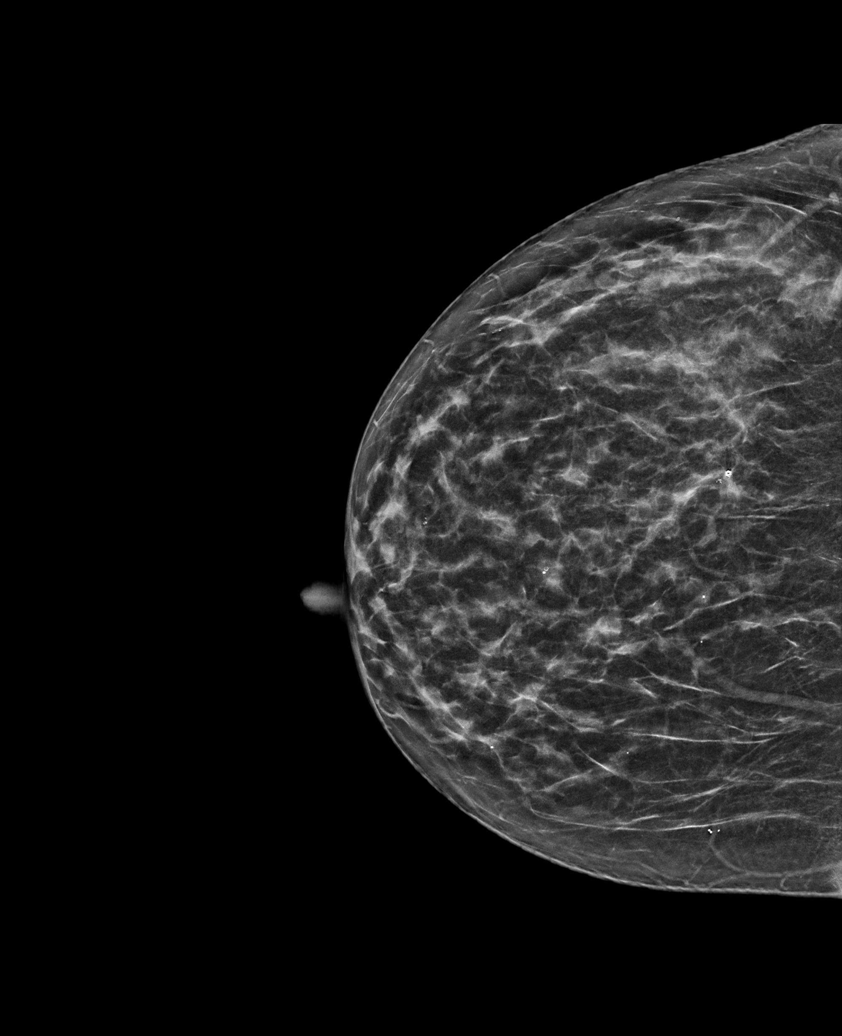

[L CC synth-2D]
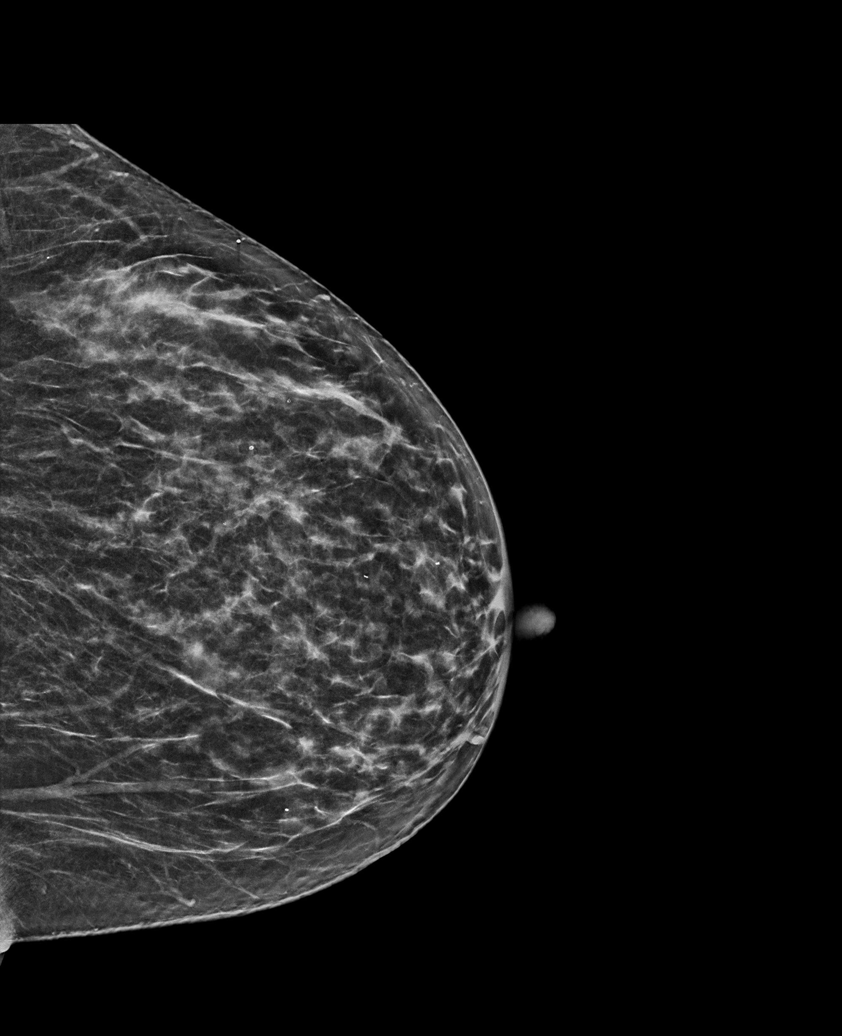

[L MLO synth-2D]
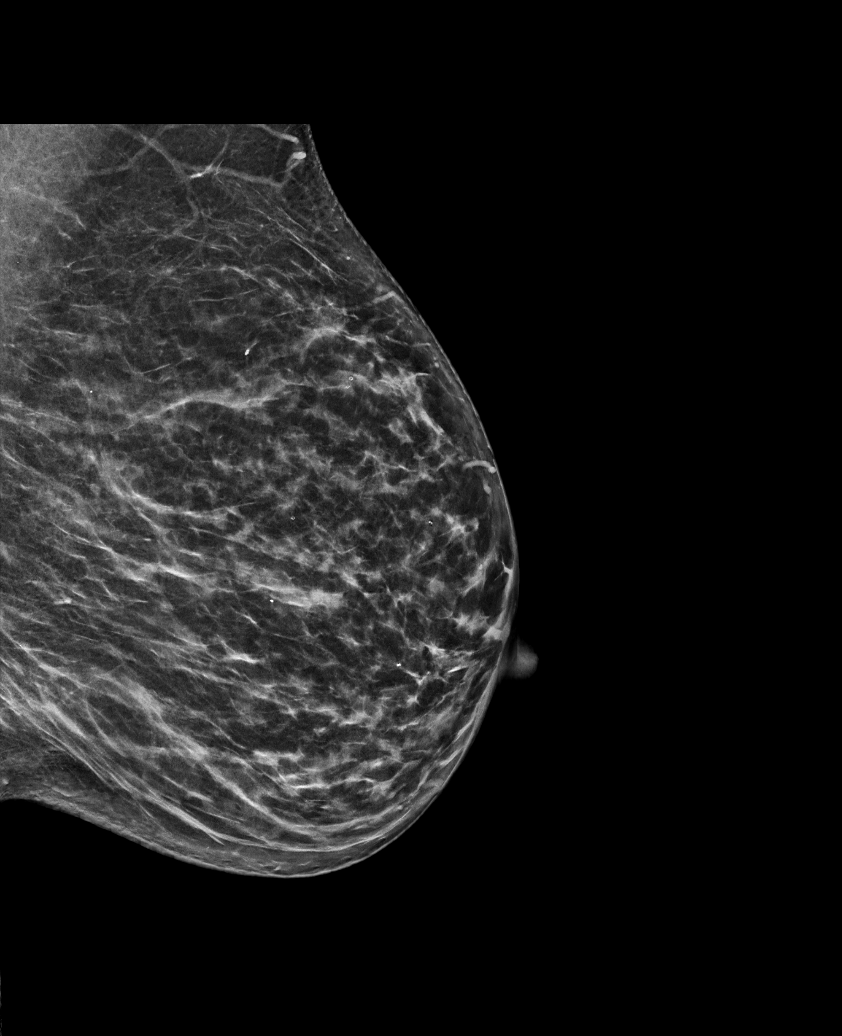

[R XCCL synth-2D]
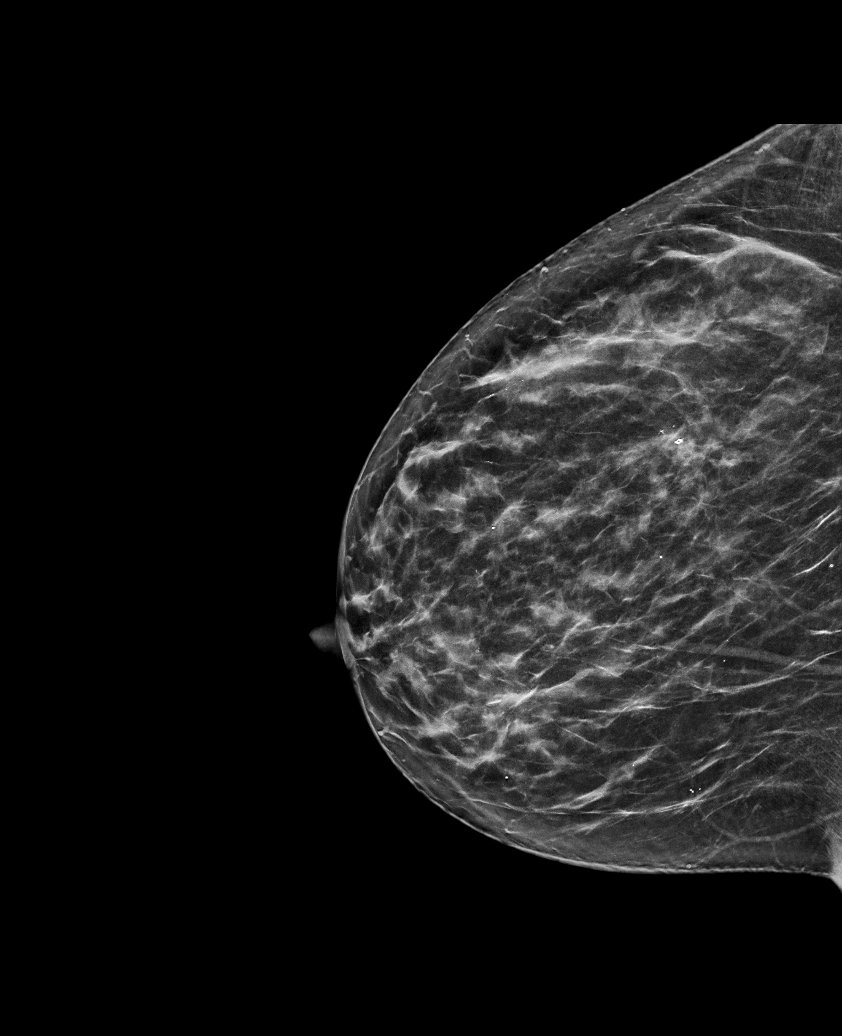

[R MLO tomo · tomo slice 33/66.0]
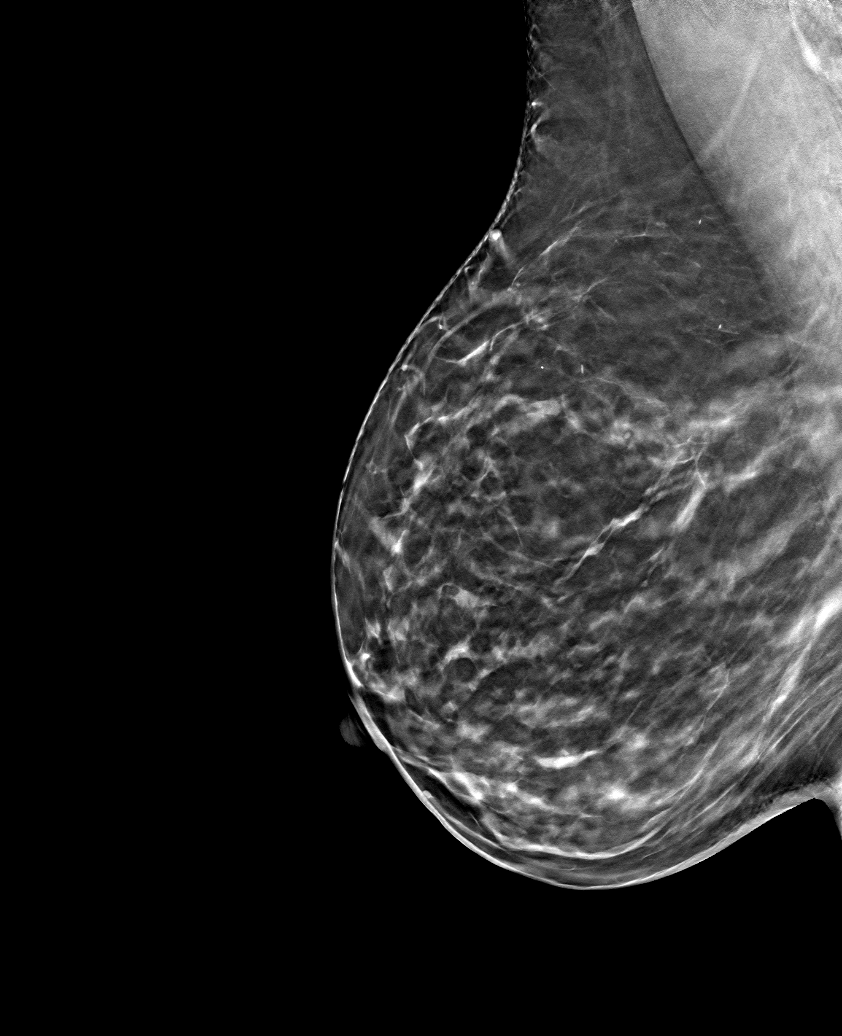

[6 of 30 positions shown; findings below may reference images not displayed]

ACR Breast Density Category c: The breast tissue is heterogeneously
dense, which may obscure small masses.
FINDINGS: There are no findings suspicious for malignancy.
IMPRESSION: No mammographic evidence of malignancy. A result letter of this
screening mammogram will be mailed directly to the patient.

RECOMMENDATION:
Screening mammogram in one year. (Code:Q3-W-BC3)

BI-RADS CATEGORY  1: Negative.

## 2022-09-12 ENCOUNTER — Other Ambulatory Visit: Payer: Self-pay | Admitting: Family Medicine

## 2022-09-12 DIAGNOSIS — Z1231 Encounter for screening mammogram for malignant neoplasm of breast: Secondary | ICD-10-CM

## 2022-11-03 ENCOUNTER — Ambulatory Visit
Admission: RE | Admit: 2022-11-03 | Discharge: 2022-11-03 | Disposition: A | Discharge status: Home / Self Care | Payer: BLUE CROSS/BLUE SHIELD | Source: Ambulatory Visit | Attending: Family Medicine | Admitting: Family Medicine

## 2022-11-03 DIAGNOSIS — Z1231 Encounter for screening mammogram for malignant neoplasm of breast: Secondary | ICD-10-CM

## 2023-01-08 ENCOUNTER — Encounter: Payer: Self-pay | Admitting: *Deleted

## 2023-01-09 ENCOUNTER — Encounter: Payer: Self-pay | Admitting: *Deleted

## 2023-01-09 ENCOUNTER — Ambulatory Visit: Payer: BC Managed Care – PPO | Admitting: Anesthesiology

## 2023-01-09 ENCOUNTER — Encounter
Admission: RE | Disposition: A | Discharge status: Home / Self Care | Payer: Self-pay | Source: Home / Self Care | Attending: Gastroenterology

## 2023-01-09 ENCOUNTER — Ambulatory Visit
Admission: RE | Admit: 2023-01-09 | Discharge: 2023-01-09 | Disposition: A | Discharge status: Home / Self Care | Payer: BC Managed Care – PPO | Attending: Gastroenterology | Admitting: Gastroenterology

## 2023-01-09 DIAGNOSIS — Z1211 Encounter for screening for malignant neoplasm of colon: Secondary | ICD-10-CM | POA: Diagnosis present

## 2023-01-09 DIAGNOSIS — D123 Benign neoplasm of transverse colon: Secondary | ICD-10-CM | POA: Insufficient documentation

## 2023-01-09 DIAGNOSIS — E1151 Type 2 diabetes mellitus with diabetic peripheral angiopathy without gangrene: Secondary | ICD-10-CM | POA: Insufficient documentation

## 2023-01-09 DIAGNOSIS — Z7985 Long-term (current) use of injectable non-insulin antidiabetic drugs: Secondary | ICD-10-CM | POA: Insufficient documentation

## 2023-01-09 DIAGNOSIS — K5730 Diverticulosis of large intestine without perforation or abscess without bleeding: Secondary | ICD-10-CM | POA: Insufficient documentation

## 2023-01-09 DIAGNOSIS — Z6830 Body mass index (BMI) 30.0-30.9, adult: Secondary | ICD-10-CM | POA: Insufficient documentation

## 2023-01-09 DIAGNOSIS — K640 First degree hemorrhoids: Secondary | ICD-10-CM | POA: Insufficient documentation

## 2023-01-09 DIAGNOSIS — D122 Benign neoplasm of ascending colon: Secondary | ICD-10-CM | POA: Diagnosis not present

## 2023-01-09 DIAGNOSIS — J45909 Unspecified asthma, uncomplicated: Secondary | ICD-10-CM | POA: Insufficient documentation

## 2023-01-09 DIAGNOSIS — D125 Benign neoplasm of sigmoid colon: Secondary | ICD-10-CM | POA: Insufficient documentation

## 2023-01-09 DIAGNOSIS — Z7984 Long term (current) use of oral hypoglycemic drugs: Secondary | ICD-10-CM | POA: Insufficient documentation

## 2023-01-09 DIAGNOSIS — G510 Bell's palsy: Secondary | ICD-10-CM | POA: Insufficient documentation

## 2023-01-09 DIAGNOSIS — E669 Obesity, unspecified: Secondary | ICD-10-CM | POA: Insufficient documentation

## 2023-01-09 DIAGNOSIS — I7121 Aneurysm of the ascending aorta, without rupture: Secondary | ICD-10-CM | POA: Diagnosis not present

## 2023-01-09 DIAGNOSIS — I10 Essential (primary) hypertension: Secondary | ICD-10-CM | POA: Diagnosis not present

## 2023-01-09 HISTORY — DX: Pain in left hip: M25.551

## 2023-01-09 HISTORY — DX: Aortic aneurysm of unspecified site, without rupture: I71.9

## 2023-01-09 HISTORY — DX: Bell's palsy: G51.0

## 2023-01-09 HISTORY — DX: Unspecified asthma, uncomplicated: J45.909

## 2023-01-09 HISTORY — PX: COLONOSCOPY WITH PROPOFOL: SHX5780

## 2023-01-09 HISTORY — DX: Headache, unspecified: R51.9

## 2023-01-09 LAB — GLUCOSE, CAPILLARY: Glucose-Capillary: 133 mg/dL — ABNORMAL HIGH (ref 70–99)

## 2023-01-09 SURGERY — COLONOSCOPY WITH PROPOFOL
Anesthesia: General

## 2023-01-09 MED ORDER — MIDAZOLAM HCL 2 MG/2ML IJ SOLN
INTRAMUSCULAR | Status: AC
  Filled 2023-01-09: qty 2

## 2023-01-09 MED ORDER — SODIUM CHLORIDE 0.9 % IV SOLN: INTRAVENOUS | Status: DC

## 2023-01-09 MED ORDER — LIDOCAINE HCL (CARDIAC) PF 100 MG/5ML IV SOSY
PREFILLED_SYRINGE | INTRAVENOUS | Status: DC | PRN
  Administered 2023-01-09: 100 mg via INTRAVENOUS

## 2023-01-09 MED ORDER — PROPOFOL 10 MG/ML IV BOLUS
INTRAVENOUS | Status: DC | PRN
  Administered 2023-01-09: 70 mg via INTRAVENOUS
  Administered 2023-01-09: 20 mg via INTRAVENOUS

## 2023-01-09 MED ORDER — PHENYLEPHRINE 80 MCG/ML (10ML) SYRINGE FOR IV PUSH (FOR BLOOD PRESSURE SUPPORT)
PREFILLED_SYRINGE | INTRAVENOUS | Status: DC | PRN
  Administered 2023-01-09 (×2): 160 ug via INTRAVENOUS

## 2023-01-09 MED ORDER — PROPOFOL 500 MG/50ML IV EMUL
INTRAVENOUS | Status: DC | PRN
  Administered 2023-01-09: 165 ug/kg/min via INTRAVENOUS

## 2023-01-09 MED ORDER — DEXMEDETOMIDINE HCL IN NACL 200 MCG/50ML IV SOLN
INTRAVENOUS | Status: DC | PRN
  Administered 2023-01-09: 12 ug via INTRAVENOUS
  Administered 2023-01-09: 8 ug via INTRAVENOUS

## 2023-01-09 NOTE — Transfer of Care (Signed)
Immediate Anesthesia Transfer of Care Note  Patient: Rachel Mueller  Procedure(s) Performed: COLONOSCOPY WITH PROPOFOL  Patient Location: Endoscopy Unit  Anesthesia Type:General  Level of Consciousness: drowsy and patient cooperative  Airway & Oxygen Therapy: Patient Spontanous Breathing and Patient connected to face mask oxygen  Post-op Assessment: Report given to RN and Post -op Vital signs reviewed and stable  Post vital signs: Reviewed and stable  Last Vitals:  Vitals Value Taken Time  BP 84/51 01/09/23 1043  Temp    Pulse 71 01/09/23 1044  Resp 13 01/09/23 1044  SpO2 100 % 01/09/23 1044  Vitals shown include unvalidated device data.  Last Pain:  Vitals:   01/09/23 1043  TempSrc:   PainSc: 0-No pain         Complications: No notable events documented.

## 2023-01-09 NOTE — H&P (Signed)
Outpatient short stay form Pre-procedure 01/09/2023  Rachel Rubenstein, MD  Primary Physician: Maryland Pink, MD  Reason for visit:  Screening  History of present illness:    62 y/o lady with history of hypertension, obesity, and arthritis here for screening colonoscopy. Had normal colonoscopy in 2012. No blood thinners. No family history of GI malignancies. No significant abdominal surgeries.    Current Facility-Administered Medications:    0.9 %  sodium chloride infusion, , Intravenous, Continuous, Debi Cousin, Hilton Cork, MD, Last Rate: 20 mL/hr at 01/09/23 0949, New Bag at 01/09/23 0949  Medications Prior to Admission  Medication Sig Dispense Refill Last Dose   albuterol (VENTOLIN HFA) 108 (90 Base) MCG/ACT inhaler Inhale 1 puff into the lungs every 6 (six) hours as needed for wheezing or shortness of breath.      cetirizine (ZYRTEC) 10 MG tablet Take 10 mg by mouth daily as needed for allergies.   Past Week   cyclobenzaprine (FLEXERIL) 5 MG tablet Take 5 mg by mouth.      Dulaglutide (TRULICITY) 4.5 0000000 SOPN Inject 4.5 mg into the skin every 7 (seven) days.      folic acid (FOLVITE) 1 MG tablet Take 1 mg by mouth in the morning and at bedtime.      Golimumab 50 MG/0.5ML SOAJ Inject 50 mg into the skin every 2 (two) months.   12/26/2022   irbesartan-hydrochlorothiazide (AVALIDE) 150-12.5 MG tablet Take 1 tablet by mouth daily.   01/09/2023   leucovorin (WELLCOVORIN) 5 MG tablet Take 5 mg by mouth once a week.   01/01/2023   metformin (FORTAMET) 500 MG (OSM) 24 hr tablet Take 1,000 mg by mouth daily with breakfast.   Past Week   methotrexate (RHEUMATREX) 2.5 MG tablet Take 2.5 mg by mouth once a week. Caution:Chemotherapy. Protect from light.   01/02/2023   Multiple Vitamin (MULTIVITAMIN) tablet Take 1 tablet by mouth daily.   Past Week   pantoprazole (PROTONIX) 20 MG tablet Take 40 mg by mouth daily.   Past Week   predniSONE (DELTASONE) 2.5 MG tablet Take 2.5 mg by mouth daily with  breakfast.   Past Week   Semaglutide,0.25 or 0.5MG/DOS, (OZEMPIC, 0.25 OR 0.5 MG/DOSE,) 2 MG/1.5ML SOPN Inject 2 mg into the skin once a week.   01/01/2023   simvastatin (ZOCOR) 20 MG tablet Take 20 mg by mouth daily.   Past Week   KRILL OIL PO Take by mouth. (Patient not taking: Reported on 01/09/2023)   Not Taking   magnesium 30 MG tablet Take 200 mg by mouth daily.        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    Physical Exam  Gen: Alert, oriented. Appears stated age.  HEENT: PERRLA. Lungs: No respiratory distress CV: RRR Abd: soft, benign, no masses Ext: No edema    Planned procedures: Proceed with colonoscopy. The patient understands the nature of the planned procedure, indications, risks, alternatives and potential complications including but not limited to bleeding, infection, perforation, damage to internal organs and possible oversedation/side effects from anesthesia. The patient agrees and gives consent to proceed.  Please refer to procedure notes for findings, recommendations and patient disposition/instructions.  Rachel Rubenstein, MD [facility]

## 2023-01-09 NOTE — Interval H&P Note (Signed)
History and Physical Interval Note:  01/09/2023 10:11 AM  Rachel Mueller  has presented today for surgery, with the diagnosis of colon cancer screening.  The various methods of treatment have been discussed with the patient and family. After consideration of risks, benefits and other options for treatment, the patient has consented to  Procedure(s): COLONOSCOPY WITH PROPOFOL (N/A) as a surgical intervention.  The patient's history has been reviewed, patient examined, no change in status, stable for surgery.  I have reviewed the patient's chart and labs.  Questions were answered to the patient's satisfaction.     Lesly Rubenstein  Ok to proceed with colonoscopy

## 2023-01-09 NOTE — Anesthesia Postprocedure Evaluation (Signed)
Anesthesia Post Note  Patient: Rachel Mueller  Procedure(s) Performed: COLONOSCOPY WITH PROPOFOL  Patient location during evaluation: PACU Anesthesia Type: General Level of consciousness: awake and alert, oriented and patient cooperative Pain management: pain level controlled Vital Signs Assessment: post-procedure vital signs reviewed and stable Respiratory status: spontaneous breathing, nonlabored ventilation and respiratory function stable Cardiovascular status: blood pressure returned to baseline and stable Postop Assessment: adequate PO intake Anesthetic complications: no   No notable events documented.   Last Vitals:  Vitals:   01/09/23 1103 01/09/23 1113  BP: (!) 92/59 97/73  Pulse: 79 71  Resp: 18 17  Temp:    SpO2: 93% 98%    Last Pain:  Vitals:   01/09/23 1113  TempSrc:   PainSc: 0-No pain                 Darrin Nipper

## 2023-01-09 NOTE — Anesthesia Preprocedure Evaluation (Addendum)
Anesthesia Evaluation  Patient identified by MRN, date of birth, ID band Patient awake    Reviewed: Allergy & Precautions, NPO status , Patient's Chart, lab work & pertinent test results  History of Anesthesia Complications Negative for: history of anesthetic complications  Airway Mallampati: II   Neck ROM: Full    Dental  (+) Implants Crowns, cracked front tooth:   Pulmonary asthma    Pulmonary exam normal breath sounds clear to auscultation       Cardiovascular hypertension, + Peripheral Vascular Disease (ascending aortic aneurysm)  Normal cardiovascular exam Rhythm:Regular Rate:Normal  CT chest 07/25/22:  Tubular ascending thoracic aorta measures a maximum of 41 mm. Additional measurements as given above.    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 Aneurysm of ascending aorta without rupture (CMS-HCC)  2. Essential hypertension  3. Hypercholesteremia   Impression and Plan: In summary, the patient is a very pleasant 62 year old female is doing remarkably very well. I am so impressed by her weight loss and her motivation to improve her quality of life. She remains very active and exercises on a regular basis. She is without any cardiac symptomatology. Her blood pressure is well controlled as are her lipids. We reviewed her most recent lipid panel from March 17, 2022 in which she is at goal. As for her ascending aortic aneurysm, it has been almost 2 years since we last assessed her aortic diameter. We will check a chest CT to reassess her aortic dimensions. I will plan to see her back in 1 year or  earlier if needed. I emphasized that she should not hesitate to contact me with any questions or concerns.    Reproductive/Obstetrics                             Anesthesia Physical Anesthesia Plan  ASA: 3  Anesthesia Plan: General   Post-op Pain Management:    Induction: Intravenous  PONV Risk Score and Plan: 3 and Propofol infusion, TIVA and Treatment may vary due to age or medical condition  Airway Management Planned: Natural Airway  Additional Equipment:   Intra-op Plan:   Post-operative Plan:   Informed Consent: I have reviewed the patients History and Physical, chart, labs and discussed the procedure including the risks, benefits and alternatives for the proposed anesthesia with the patient or authorized representative who has indicated his/her understanding and acceptance.       Plan Discussed with: CRNA  Anesthesia Plan Comments: (LMA/GETA backup discussed.  Patient consented for risks of anesthesia including but not limited to:  - adverse reactions to medications - damage to eyes, teeth, lips or other oral mucosa - nerve damage due to positioning  - sore throat or hoarseness - damage to heart, brain, nerves, lungs, other parts of body or loss of life  Informed patient about role of CRNA in peri- and intra-operative care.  Patient voiced understanding.)        Anesthesia Quick Evaluation

## 2023-01-09 NOTE — Op Note (Signed)
[facility] Gastroenterology Patient Name: Rachel Mueller Procedure Date: 01/09/2023 10:06 AM MRN: [id number] Account #: [id number] Date of Birth: 1961-11-04 Admit Type: Outpatient Age: 62 Room: [facility] ENDO ROOM 1 Gender: Female Note Status: Finalized Instrument Name: Park [id number] [id number] Procedure:             Colonoscopy Indications:           Screening for colorectal malignant neoplasm Providers:             Andrey Farmer MD, MD Referring MD:          Irven Easterly. Kary Kos, MD (Referring MD) Medicines:             Monitored Anesthesia Care Complications:         No immediate complications. Estimated blood loss:                         Minimal. Procedure:             Pre-Anesthesia Assessment:                        - Prior to the procedure, a History and Physical was                         performed, and patient medications and allergies were                         reviewed. The patient is competent. The risks and                         benefits of the procedure and the sedation options and                         risks were discussed with the patient. All questions                         were answered and informed consent was obtained.                         Patient identification and proposed procedure were                         verified by the physician, the nurse, the                         anesthesiologist, the anesthetist and the technician                         in the endoscopy suite. Mental Status Examination:                         alert and oriented. Airway Examination: normal                         oropharyngeal airway and neck mobility. Respiratory                         Examination: clear to auscultation. CV Examination:  normal. Prophylactic Antibiotics: The patient does not                         require prophylactic antibiotics. Prior                         Anticoagulants: The patient has taken no anticoagulant                          or antiplatelet agents. ASA Grade Assessment: III - A                         patient with severe systemic disease. After reviewing                         the risks and benefits, the patient was deemed in                         satisfactory condition to undergo the procedure. The                         anesthesia plan was to use monitored anesthesia care                         (MAC). Immediately prior to administration of                         medications, the patient was re-assessed for adequacy                         to receive sedatives. The heart rate, respiratory                         rate, oxygen saturations, blood pressure, adequacy of                         pulmonary ventilation, and response to care were                         monitored throughout the procedure. The physical                         status of the patient was re-assessed after the                         procedure.                        After obtaining informed consent, the colonoscope was                         passed under direct vision. Throughout the procedure,                         the patient's blood pressure, pulse, and oxygen                         saturations were monitored continuously. The  Colonoscope was introduced through the anus and                         advanced to the the cecum, identified by appendiceal                         orifice and ileocecal valve. The colonoscopy was                         somewhat difficult due to significant looping.                         Successful completion of the procedure was aided by                         applying abdominal pressure. The patient tolerated the                         procedure well. The quality of the bowel preparation                         was good. The ileocecal valve, appendiceal orifice,                         and rectum were photographed. Findings:      The perianal and  digital rectal examinations were normal.      A 4 mm polyp was found in the ascending colon. The polyp was sessile.       The polyp was removed with a cold snare. Resection and retrieval were       complete. Estimated blood loss was minimal.      Two sessile polyps were found in the transverse colon. The polyps were 3       mm in size. These polyps were removed with a cold snare. Resection and       retrieval were complete. Estimated blood loss was minimal.      A 2 mm polyp was found in the sigmoid colon. The polyp was sessile. The       polyp was removed with a cold snare. Resection and retrieval were       complete. Estimated blood loss was minimal.      Multiple large-mouthed and small-mouthed diverticula were found in the       sigmoid colon.      Internal hemorrhoids were found during retroflexion. The hemorrhoids       were Grade I (internal hemorrhoids that do not prolapse).      The exam was otherwise without abnormality on direct and retroflexion       views. Impression:            - One 4 mm polyp in the ascending colon, removed with                         a cold snare. Resected and retrieved.                        - Two 3 mm polyps in the transverse colon, removed  with a cold snare. Resected and retrieved.                        - One 2 mm polyp in the sigmoid colon, removed with a                         cold snare. Resected and retrieved.                        - Diverticulosis in the sigmoid colon.                        - Internal hemorrhoids.                        - The examination was otherwise normal on direct and                         retroflexion views. Recommendation:        - Discharge patient to home.                        - Resume previous diet.                        - Continue present medications.                        - Await pathology results.                        - Repeat colonoscopy in 3 years for surveillance.                         - Return to referring physician as previously                         scheduled. Procedure Code(s):     --- Professional ---                        [phone], Colonoscopy, flexible; with removal of                         tumor(s), polyp(s), or other lesion(s) by snare                         technique Diagnosis Code(s):     --- Professional ---                        Z12.11, Encounter for screening for malignant neoplasm                         of colon                        D12.2, Benign neoplasm of ascending colon                        D12.5, Benign neoplasm of sigmoid colon  D12.3, Benign neoplasm of transverse colon (hepatic                         flexure or splenic flexure)                        K64.0, First degree hemorrhoids                        K57.30, Diverticulosis of large intestine without                         perforation or abscess without bleeding CPT copyright 2022 [employer]. All rights reserved. The codes documented in this report are preliminary and upon coder review may  be revised to meet current compliance requirements. Andrey Farmer MD, MD 01/09/2023 10:45:15 AM Number of Addenda: 0 Note Initiated On: 01/09/2023 10:06 AM Scope Withdrawal Time: 0 hours 12 minutes 28 seconds  Total Procedure Duration: 0 hours 22 minutes 24 seconds  Estimated Blood Loss:  Estimated blood loss was minimal.      [place]

## 2023-01-10 ENCOUNTER — Encounter: Payer: Self-pay | Admitting: Gastroenterology

## 2023-01-10 LAB — SURGICAL PATHOLOGY

## 2023-11-01 ENCOUNTER — Other Ambulatory Visit: Payer: Self-pay

## 2023-11-01 ENCOUNTER — Encounter: Payer: Self-pay | Admitting: Emergency Medicine

## 2023-11-01 DIAGNOSIS — I714 Abdominal aortic aneurysm, without rupture, unspecified: Secondary | ICD-10-CM | POA: Diagnosis present

## 2023-11-01 DIAGNOSIS — E669 Obesity, unspecified: Secondary | ICD-10-CM | POA: Diagnosis present

## 2023-11-01 DIAGNOSIS — K529 Noninfective gastroenteritis and colitis, unspecified: Secondary | ICD-10-CM | POA: Diagnosis present

## 2023-11-01 DIAGNOSIS — J45909 Unspecified asthma, uncomplicated: Secondary | ICD-10-CM | POA: Diagnosis present

## 2023-11-01 DIAGNOSIS — Z7984 Long term (current) use of oral hypoglycemic drugs: Secondary | ICD-10-CM

## 2023-11-01 DIAGNOSIS — L4050 Arthropathic psoriasis, unspecified: Secondary | ICD-10-CM | POA: Diagnosis present

## 2023-11-01 DIAGNOSIS — K5790 Diverticulosis of intestine, part unspecified, without perforation or abscess without bleeding: Secondary | ICD-10-CM | POA: Diagnosis present

## 2023-11-01 DIAGNOSIS — E119 Type 2 diabetes mellitus without complications: Secondary | ICD-10-CM | POA: Diagnosis present

## 2023-11-01 DIAGNOSIS — Z7985 Long-term (current) use of injectable non-insulin antidiabetic drugs: Secondary | ICD-10-CM

## 2023-11-01 DIAGNOSIS — Z1152 Encounter for screening for COVID-19: Secondary | ICD-10-CM

## 2023-11-01 DIAGNOSIS — M19031 Primary osteoarthritis, right wrist: Secondary | ICD-10-CM | POA: Diagnosis present

## 2023-11-01 DIAGNOSIS — S42301D Unspecified fracture of shaft of humerus, right arm, subsequent encounter for fracture with routine healing: Secondary | ICD-10-CM

## 2023-11-01 DIAGNOSIS — Z90710 Acquired absence of both cervix and uterus: Secondary | ICD-10-CM

## 2023-11-01 DIAGNOSIS — Z79631 Long term (current) use of antimetabolite agent: Secondary | ICD-10-CM

## 2023-11-01 DIAGNOSIS — Z79899 Other long term (current) drug therapy: Secondary | ICD-10-CM

## 2023-11-01 DIAGNOSIS — A419 Sepsis, unspecified organism: Principal | ICD-10-CM | POA: Diagnosis present

## 2023-11-01 DIAGNOSIS — E785 Hyperlipidemia, unspecified: Secondary | ICD-10-CM | POA: Diagnosis present

## 2023-11-01 DIAGNOSIS — E876 Hypokalemia: Secondary | ICD-10-CM | POA: Diagnosis present

## 2023-11-01 DIAGNOSIS — E860 Dehydration: Secondary | ICD-10-CM | POA: Diagnosis not present

## 2023-11-01 DIAGNOSIS — Z7952 Long term (current) use of systemic steroids: Secondary | ICD-10-CM

## 2023-11-01 DIAGNOSIS — I10 Essential (primary) hypertension: Secondary | ICD-10-CM | POA: Diagnosis present

## 2023-11-01 LAB — COMPREHENSIVE METABOLIC PANEL
ALT: 17 U/L (ref 0–44)
AST: 18 U/L (ref 15–41)
Albumin: 3.6 g/dL (ref 3.5–5.0)
Alkaline Phosphatase: 80 U/L (ref 38–126)
Anion gap: 11 (ref 5–15)
BUN: 10 mg/dL (ref 8–23)
CO2: 26 mmol/L (ref 22–32)
Calcium: 9.1 mg/dL (ref 8.9–10.3)
Chloride: 98 mmol/L (ref 98–111)
Creatinine, Ser: 0.8 mg/dL (ref 0.44–1.00)
GFR, Estimated: >60 mL/min (ref >60)
Glucose, Bld: 152 mg/dL — ABNORMAL HIGH (ref 70–99)
Potassium: 4.3 mmol/L (ref 3.5–5.1)
Sodium: 135 mmol/L (ref 135–145)
Total Bilirubin: 0.8 mg/dL (ref <1.2)
Total Protein: 7.9 g/dL (ref 6.5–8.1)

## 2023-11-01 LAB — CBC
HCT: 42.6 % (ref 36.0–46.0)
Hemoglobin: 14.3 g/dL (ref 12.0–15.0)
MCH: 31 pg (ref 26.0–34.0)
MCHC: 33.6 g/dL (ref 30.0–36.0)
MCV: 92.4 fL (ref 80.0–100.0)
Platelets: 485 10*3/uL — ABNORMAL HIGH (ref 150–400)
RBC: 4.61 MIL/uL (ref 3.87–5.11)
RDW: 12.6 % (ref 11.5–15.5)
WBC: 25.1 10*3/uL — ABNORMAL HIGH (ref 4.0–10.5)
nRBC: 0 % (ref 0.0–0.2)

## 2023-11-01 LAB — RESP PANEL BY RT-PCR (RSV, FLU A&B, COVID)  RVPGX2
Influenza A by PCR: NEGATIVE
Influenza B by PCR: NEGATIVE
Resp Syncytial Virus by PCR: NEGATIVE
SARS Coronavirus 2 by RT PCR: NEGATIVE

## 2023-11-01 LAB — LIPASE, BLOOD: Lipase: 21 U/L (ref 11–51)

## 2023-11-01 MED ORDER — ONDANSETRON 4 MG PO TBDP
4.0000 mg | ORAL_TABLET | Freq: Once | ORAL | Status: AC | PRN
  Administered 2023-11-01: 4 mg via ORAL
  Filled 2023-11-01: qty 1

## 2023-11-01 NOTE — ED Triage Notes (Signed)
Patient ambulatory to triage with complaints of vomiting and diarrhea today. She states she feels very dehydrated and weak. Patient also states she is a T2 diabetic and is concerned for her blood sugar due to not being able to eat. Last checked around 3pm, was 180. Patient also endorses headache.

## 2023-11-02 ENCOUNTER — Emergency Department: Payer: BC Managed Care – PPO

## 2023-11-02 ENCOUNTER — Inpatient Hospital Stay
Admission: EM | Admit: 2023-11-02 | Discharge: 2023-11-05 | DRG: 872 | Disposition: A | Discharge status: Home / Self Care | Payer: BC Managed Care – PPO | Attending: Internal Medicine | Admitting: Internal Medicine

## 2023-11-02 DIAGNOSIS — M1990 Unspecified osteoarthritis, unspecified site: Secondary | ICD-10-CM

## 2023-11-02 DIAGNOSIS — E119 Type 2 diabetes mellitus without complications: Secondary | ICD-10-CM | POA: Diagnosis present

## 2023-11-02 DIAGNOSIS — K529 Noninfective gastroenteritis and colitis, unspecified: Secondary | ICD-10-CM | POA: Diagnosis present

## 2023-11-02 DIAGNOSIS — A419 Sepsis, unspecified organism: Principal | ICD-10-CM

## 2023-11-02 DIAGNOSIS — E1169 Type 2 diabetes mellitus with other specified complication: Secondary | ICD-10-CM

## 2023-11-02 DIAGNOSIS — K5790 Diverticulosis of intestine, part unspecified, without perforation or abscess without bleeding: Secondary | ICD-10-CM | POA: Diagnosis present

## 2023-11-02 DIAGNOSIS — I10 Essential (primary) hypertension: Secondary | ICD-10-CM

## 2023-11-02 DIAGNOSIS — D72829 Elevated white blood cell count, unspecified: Secondary | ICD-10-CM | POA: Insufficient documentation

## 2023-11-02 DIAGNOSIS — Z79631 Long term (current) use of antimetabolite agent: Secondary | ICD-10-CM | POA: Diagnosis not present

## 2023-11-02 DIAGNOSIS — Z7984 Long term (current) use of oral hypoglycemic drugs: Secondary | ICD-10-CM | POA: Diagnosis not present

## 2023-11-02 DIAGNOSIS — L4050 Arthropathic psoriasis, unspecified: Secondary | ICD-10-CM | POA: Diagnosis present

## 2023-11-02 DIAGNOSIS — E860 Dehydration: Secondary | ICD-10-CM | POA: Diagnosis present

## 2023-11-02 DIAGNOSIS — I714 Abdominal aortic aneurysm, without rupture, unspecified: Secondary | ICD-10-CM

## 2023-11-02 DIAGNOSIS — E876 Hypokalemia: Secondary | ICD-10-CM

## 2023-11-02 DIAGNOSIS — Z90710 Acquired absence of both cervix and uterus: Secondary | ICD-10-CM | POA: Diagnosis not present

## 2023-11-02 DIAGNOSIS — Z7952 Long term (current) use of systemic steroids: Secondary | ICD-10-CM | POA: Diagnosis not present

## 2023-11-02 DIAGNOSIS — S42301D Unspecified fracture of shaft of humerus, right arm, subsequent encounter for fracture with routine healing: Secondary | ICD-10-CM | POA: Diagnosis not present

## 2023-11-02 DIAGNOSIS — Z79899 Other long term (current) drug therapy: Secondary | ICD-10-CM | POA: Diagnosis not present

## 2023-11-02 DIAGNOSIS — M19031 Primary osteoarthritis, right wrist: Secondary | ICD-10-CM | POA: Diagnosis present

## 2023-11-02 DIAGNOSIS — E785 Hyperlipidemia, unspecified: Secondary | ICD-10-CM

## 2023-11-02 DIAGNOSIS — Z1152 Encounter for screening for COVID-19: Secondary | ICD-10-CM | POA: Diagnosis not present

## 2023-11-02 DIAGNOSIS — J45909 Unspecified asthma, uncomplicated: Secondary | ICD-10-CM | POA: Diagnosis present

## 2023-11-02 DIAGNOSIS — E669 Obesity, unspecified: Secondary | ICD-10-CM | POA: Diagnosis present

## 2023-11-02 DIAGNOSIS — Z7985 Long-term (current) use of injectable non-insulin antidiabetic drugs: Secondary | ICD-10-CM | POA: Diagnosis not present

## 2023-11-02 LAB — URINALYSIS, ROUTINE W REFLEX MICROSCOPIC
Bilirubin Urine: NEGATIVE
Glucose, UA: NEGATIVE mg/dL
Ketones, ur: 20 mg/dL — AB
Nitrite: NEGATIVE
Protein, ur: NEGATIVE mg/dL
Specific Gravity, Urine: 1.02 (ref 1.005–1.030)
pH: 5 (ref 5.0–8.0)

## 2023-11-02 LAB — HEMOGLOBIN A1C
Hgb A1c MFr Bld: 6 % — ABNORMAL HIGH (ref 4.8–5.6)
Mean Plasma Glucose: 125.5 mg/dL

## 2023-11-02 LAB — CBG MONITORING, ED
Glucose-Capillary: 112 mg/dL — ABNORMAL HIGH (ref 70–99)
Glucose-Capillary: 125 mg/dL — ABNORMAL HIGH (ref 70–99)
Glucose-Capillary: 100 mg/dL — ABNORMAL HIGH (ref 70–99)
Glucose-Capillary: 164 mg/dL — ABNORMAL HIGH (ref 70–99)
Glucose-Capillary: 97 mg/dL (ref 70–99)

## 2023-11-02 LAB — CBC WITH DIFFERENTIAL/PLATELET
Abs Immature Granulocytes: 0.19 10*3/uL — ABNORMAL HIGH (ref 0.00–0.07)
Basophils Absolute: 0.1 10*3/uL (ref 0.0–0.1)
Basophils Relative: 0 %
Eosinophils Absolute: 0 10*3/uL (ref 0.0–0.5)
Eosinophils Relative: 0 %
HCT: 43.2 % (ref 36.0–46.0)
Hemoglobin: 14.3 g/dL (ref 12.0–15.0)
Immature Granulocytes: 1 %
Lymphocytes Relative: 7 %
Lymphs Abs: 1.7 10*3/uL (ref 0.7–4.0)
MCH: 31.3 pg (ref 26.0–34.0)
MCHC: 33.1 g/dL (ref 30.0–36.0)
MCV: 94.5 fL (ref 80.0–100.0)
Monocytes Absolute: 2 10*3/uL — ABNORMAL HIGH (ref 0.1–1.0)
Monocytes Relative: 8 %
Neutro Abs: 21 10*3/uL — ABNORMAL HIGH (ref 1.7–7.7)
Neutrophils Relative %: 84 %
Platelets: 504 10*3/uL — ABNORMAL HIGH (ref 150–400)
RBC: 4.57 MIL/uL (ref 3.87–5.11)
RDW: 12.5 % (ref 11.5–15.5)
WBC: 24.8 10*3/uL — ABNORMAL HIGH (ref 4.0–10.5)
nRBC: 0 % (ref 0.0–0.2)

## 2023-11-02 LAB — SEDIMENTATION RATE: Sed Rate: 56 mm/h — ABNORMAL HIGH (ref 0–30)

## 2023-11-02 LAB — C-REACTIVE PROTEIN: CRP: 7.9 mg/dL — ABNORMAL HIGH (ref <1.0)

## 2023-11-02 LAB — HIV ANTIBODY (ROUTINE TESTING W REFLEX): HIV Screen 4th Generation wRfx: NONREACTIVE

## 2023-11-02 LAB — HEMOGLOBIN AND HEMATOCRIT, BLOOD
HCT: 37.1 % (ref 36.0–46.0)
HCT: 34.3 % — ABNORMAL LOW (ref 36.0–46.0)
Hemoglobin: 12.6 g/dL (ref 12.0–15.0)
Hemoglobin: 11.4 g/dL — ABNORMAL LOW (ref 12.0–15.0)

## 2023-11-02 LAB — LACTIC ACID, PLASMA
Lactic Acid, Venous: 1.3 mmol/L (ref 0.5–1.9)
Lactic Acid, Venous: 2.2 mmol/L (ref 0.5–1.9)

## 2023-11-02 LAB — PROCALCITONIN: Procalcitonin: 1.27 ng/mL

## 2023-11-02 MED ORDER — ONDANSETRON HCL 4 MG PO TABS: 4.0000 mg | ORAL_TABLET | Freq: Four times a day (QID) | ORAL | Status: DC | PRN

## 2023-11-02 MED ORDER — METRONIDAZOLE 500 MG/100ML IV SOLN
500.0000 mg | Freq: Once | INTRAVENOUS | Status: AC
  Administered 2023-11-02: 500 mg via INTRAVENOUS
  Filled 2023-11-02: qty 100

## 2023-11-02 MED ORDER — ENOXAPARIN SODIUM 40 MG/0.4ML IJ SOSY: 40.0000 mg | PREFILLED_SYRINGE | INTRAMUSCULAR | Status: DC

## 2023-11-02 MED ORDER — METRONIDAZOLE 500 MG/100ML IV SOLN
500.0000 mg | Freq: Two times a day (BID) | INTRAVENOUS | Status: DC
  Administered 2023-11-02 – 2023-11-03 (×2): 500 mg via INTRAVENOUS
  Filled 2023-11-02 (×2): qty 100

## 2023-11-02 MED ORDER — SODIUM CHLORIDE 0.9 % IV SOLN
2.0000 g | INTRAVENOUS | Status: DC
  Administered 2023-11-03 – 2023-11-05 (×3): 2 g via INTRAVENOUS
  Filled 2023-11-02 (×3): qty 20

## 2023-11-02 MED ORDER — PREDNISONE 20 MG PO TABS
20.0000 mg | ORAL_TABLET | Freq: Every day | ORAL | Status: DC
  Administered 2023-11-02 – 2023-11-05 (×4): 20 mg via ORAL
  Filled 2023-11-02 (×4): qty 1

## 2023-11-02 MED ORDER — LACTATED RINGERS IV SOLN: INTRAVENOUS | Status: AC

## 2023-11-02 MED ORDER — INSULIN ASPART 100 UNIT/ML IJ SOLN
0.0000 [IU] | Freq: Three times a day (TID) | INTRAMUSCULAR | Status: DC
Start: 2023-11-02 — End: 2023-11-05
  Administered 2023-11-02: 3 [IU] via SUBCUTANEOUS
  Administered 2023-11-02 – 2023-11-03 (×2): 2 [IU] via SUBCUTANEOUS
  Administered 2023-11-04: 3 [IU] via SUBCUTANEOUS
  Filled 2023-11-02 (×4): qty 1

## 2023-11-02 MED ORDER — ONDANSETRON HCL 4 MG/2ML IJ SOLN: 4.0000 mg | Freq: Four times a day (QID) | INTRAMUSCULAR | Status: DC | PRN

## 2023-11-02 MED ORDER — ACETAMINOPHEN 325 MG PO TABS: 650.0000 mg | ORAL_TABLET | Freq: Four times a day (QID) | ORAL | Status: DC | PRN

## 2023-11-02 MED ORDER — LACTATED RINGERS IV BOLUS (SEPSIS)
1000.0000 mL | Freq: Once | INTRAVENOUS | Status: AC
  Administered 2023-11-02: 1000 mL via INTRAVENOUS

## 2023-11-02 MED ORDER — SODIUM CHLORIDE 0.9 % IV SOLN
2.0000 g | Freq: Once | INTRAVENOUS | Status: AC
Start: 2023-11-02 — End: 2023-11-02
  Administered 2023-11-02: 2 g via INTRAVENOUS
  Filled 2023-11-02: qty 20

## 2023-11-02 MED ORDER — INSULIN ASPART 100 UNIT/ML IJ SOLN: 0.0000 [IU] | Freq: Every day | INTRAMUSCULAR | Status: DC

## 2023-11-02 MED ORDER — IOHEXOL 300 MG/ML  SOLN
100.0000 mL | Freq: Once | INTRAMUSCULAR | Status: AC | PRN
  Administered 2023-11-02: 100 mL via INTRAVENOUS

## 2023-11-02 NOTE — Assessment & Plan Note (Signed)
Baseline AAA followed by [facility] Cardiology  06/2022 CTA with stable diameter of 4.1 cm

## 2023-11-02 NOTE — Progress Notes (Signed)
Pt reports trace blood w/ BM  Otherwise stable  Will hold AC for now  Discussed w/ patient at the bedside  Monitor Consider GI consultation if bleeding significantly worsens.  Trend hgb

## 2023-11-02 NOTE — Assessment & Plan Note (Signed)
Baseline psoriatic arthritis  Overall stable disease at present  Monitor

## 2023-11-02 NOTE — Assessment & Plan Note (Signed)
Continue blood pressure control with irbersartan.   No clinical signs of heart failure. Follow up on echocardiogram.  Heart failure exacerbation ruled out.

## 2023-11-02 NOTE — H&P (Addendum)
History and Physical    Patient: Rachel Mueller:[id number] DOB: 1961-09-20 DOA: 11/02/2023 DOS: the patient was seen and examined on 11/02/2023 PCP: Jerl Mina, MD  Patient coming from: Home  Chief Complaint:  Chief Complaint  Patient presents with   Emesis   HPI: Rachel Mueller is a 62 y.o. female with medical history significant of AAA, asthma, T2DM, htn, psoriatic arthritis presenting w/ sepsis, colitis. Pt reports progressively worsening LLQ abd pain over past 1-2 days. Pain moderate to severe in intensity. + recurrent nausea and vomiting. NBNB. + diarrhea. NBNB. Pt denies any recent dietary changes. No undercooked food. No reported out of the country travel. Symptoms have progressively worsened over the past 12-24 hours. Does report prior hx/o diverticulosis on colonoscopy earlier this year. No CP or SOB. 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 Recently started on steroid burst by orthopedic surgery and rheumatology. Blood sugars overall well controlled.   Presented to ER afebrile. HR 100s. WBC 25, hgb 14, plt 480s, PCT 1.27, UA leukocyte +, CT A&P showing acute colitis. GI panel and C diff screen ordered.  Review of Systems: As mentioned in the history of present illness. All other systems reviewed and are negative. 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    Headache    Hypertension    Past Surgical History:  Procedure Laterality Date   ABDOMINAL HYSTERECTOMY     achilies surgery     ACHILLES TENDON SURGERY     ANKLE ARTHROSCOPY     BREAST BIOPSY Right 03/14/2021   FIBROCYSTIC CHANGE WITH FIBROADENOMATOID CHANGE AND DYSTROPHIC CALCIFICATIONS   COLONOSCOPY WITH PROPOFOL N/A 01/09/2023   Procedure: COLONOSCOPY WITH PROPOFOL;  Surgeon: Regis Bill, MD;  Location: [facility] ENDOSCOPY;   Service: Endoscopy;  Laterality: N/A;   KNEE ARTHROSCOPY     left foot surgery     Right shoulder surgery     TONSILLECTOMY     UPPER GI ENDOSCOPY     Social History:  reports that she has never smoked. She does not have any smokeless tobacco history on file. She reports current alcohol use. She reports that she does not use drugs.  No Known Allergies  Family History  Problem Relation Age of Onset   Breast cancer Mother    Breast cancer Sister 35   Breast cancer Sister 71   Breast cancer Maternal Aunt    Breast cancer Maternal Aunt    Breast cancer Maternal Aunt    Breast cancer Cousin     Prior to Admission medications   Medication Sig Start Date End Date Taking? Authorizing Provider  atorvastatin (LIPITOR) 20 MG tablet Take 1 tablet by mouth daily. 07/10/23  Yes [provider]  azelastine (ASTELIN) 0.1 % nasal spray Place 1-2 sprays into both nostrils 2 (two) times daily. 07/20/23  Yes [provider]  VEOZAH 45 MG TABS Take 1 tablet by mouth daily. 01/01/23  Yes [provider]  albuterol (VENTOLIN HFA) 108 (90 Base) MCG/ACT inhaler Inhale 1 puff into the lungs every 6 (six) hours as needed for wheezing or shortness of breath.    [provider]  amphetamine-dextroamphetamine (ADDERALL) 10 MG tablet Take 10 mg by mouth 2 (two) times daily.    [provider]  cetirizine Harless Nakayama)  10 MG tablet Take 10 mg by mouth daily as needed for allergies.    [provider]  cyclobenzaprine (FLEXERIL) 5 MG tablet Take 5 mg by mouth.    [provider]  Dulaglutide (TRULICITY) 4.5 MG/0.5ML SOPN Inject 4.5 mg into the skin every 7 (seven) days.    [provider]  folic acid (FOLVITE) 1 MG tablet Take 1 mg by mouth in the morning and at bedtime.    [provider]  Golimumab 50 MG/0.5ML SOAJ Inject 50 mg into the skin every 2 (two) months.    [provider]  irbesartan-hydrochlorothiazide (AVALIDE) 150-12.5  MG tablet Take 1 tablet by mouth daily.    [provider]  KRILL OIL PO Take by mouth. Patient not taking: Reported on 01/09/2023    [provider]  leucovorin (WELLCOVORIN) 5 MG tablet Take 5 mg by mouth once a week.    [provider]  magnesium 30 MG tablet Take 200 mg by mouth daily.    [provider]  metformin (FORTAMET) 500 MG (OSM) 24 hr tablet Take 1,000 mg by mouth daily with breakfast.    [provider]  methotrexate (RHEUMATREX) 2.5 MG tablet Take 2.5 mg by mouth once a week. Caution:Chemotherapy. Protect from light.    [provider]  Multiple Vitamin (MULTIVITAMIN) tablet Take 1 tablet by mouth daily.    [provider]  pantoprazole (PROTONIX) 20 MG tablet Take 40 mg by mouth daily.    [provider]  predniSONE (DELTASONE) 2.5 MG tablet Take 2.5 mg by mouth daily with breakfast.    [provider]  Semaglutide,0.25 or 0.5MG /DOS, (OZEMPIC, 0.25 OR 0.5 MG/DOSE,) 2 MG/1.5ML SOPN Inject 2 mg into the skin once a week.    [provider]  simvastatin (ZOCOR) 20 MG tablet Take 20 mg by mouth daily.    [provider]    Physical Exam: Vitals:   11/01/23 2056 11/01/23 2250 11/02/23 0127 11/02/23 0813  BP: 107/72 94/69 135/82   Pulse: (!) 104 94 90   Resp: 18 18 18    Temp: 98.3 F (36.8 C) 98.7 F (37.1 C) 98.2 F (36.8 C) 98.1 F (36.7 C)  TempSrc: Oral Oral Oral Oral  SpO2: 96% 94% 93%   Weight: 73.5 kg     Height: 5\' 4"  (1.626 m)      Physical Exam Constitutional:      Appearance: She is obese.  HENT:     Head: Normocephalic and atraumatic.     Nose: Nose normal.     Mouth/Throat:     Mouth: Mucous membranes are moist.  Eyes:     Pupils: Pupils are equal, round, and reactive to light.  Cardiovascular:     Rate and Rhythm: Normal rate and regular rhythm.  Pulmonary:     Effort: Pulmonary effort is normal.  Abdominal:     General: Bowel sounds are normal.      Comments: + LLQ TTP   Musculoskeletal:        General: Normal range of motion.  Skin:    General: Skin is warm.  Neurological:     General: No focal deficit present.  Psychiatric:        Mood and Affect: Mood normal.     Data Reviewed:  There are no new results to review at this time.  CT ABDOMEN PELVIS W CONTRAST CLINICAL DATA:  Abdominal pain  EXAM: CT ABDOMEN AND PELVIS WITH CONTRAST  TECHNIQUE: Multidetector CT  imaging of the abdomen and pelvis was performed using the standard protocol following bolus administration of intravenous contrast.  RADIATION DOSE REDUCTION: This exam was performed according to the departmental dose-optimization program which includes automated exposure control, adjustment of the mA and/or kV according to patient size and/or use of iterative reconstruction technique.  CONTRAST:  OMNIPAQUE IOHEXOL 300 MG/ML  SOLN  COMPARISON:  None Available.  FINDINGS: Lower Chest: Normal.  Hepatobiliary: Normal hepatic contours. No intra- or extrahepatic biliary dilatation. The gallbladder is normal.  Pancreas: Normal pancreas. No ductal dilatation or peripancreatic fluid collection.  Spleen: Normal.  Adrenals/Urinary Tract: The adrenal glands are normal. No hydronephrosis, nephroureterolithiasis or solid renal mass. The urinary bladder is normal for degree of distention  Stomach/Bowel: There is no hiatal hernia. Normal duodenal course and caliber. No small bowel dilatation or inflammation. There is mild inflammatory stranding along the course of the descending colon. There is sigmoid diverticulosis. Normal appendix.  Vascular/Lymphatic: There is calcific atherosclerosis of the abdominal aorta. No lymphadenopathy.  Reproductive: Status post hysterectomy. No adnexal mass.  Other: None.  Musculoskeletal: Multilevel degenerative disc disease and facet arthrosis. No bony spinal canal stenosis.  IMPRESSION: 1. Mild inflammatory  stranding along the course of the descending colon, consistent with acute colitis.  Aortic Atherosclerosis (ICD10-I70.0).  Electronically Signed   By: Deatra Robinson M.D.   On: 11/02/2023 03:10  Lab Results  Component Value Date   WBC 25.1 (H) 11/01/2023   WBC 24.8 (H) 11/01/2023   HGB 14.3 11/01/2023   HGB 14.3 11/01/2023   HCT 42.6 11/01/2023   HCT 43.2 11/01/2023   MCV 92.4 11/01/2023   MCV 94.5 11/01/2023   PLT 485 (H) 11/01/2023   PLT 504 (H) 11/01/2023   Last metabolic panel Lab Results  Component Value Date   GLUCOSE 152 (H) 11/01/2023   NA 135 11/01/2023   K 4.3 11/01/2023   CL 98 11/01/2023   CO2 26 11/01/2023   BUN 10 11/01/2023   CREATININE 0.80 11/01/2023   GFRNONAA >60 11/01/2023   CALCIUM 9.1 11/01/2023   PROT 7.9 11/01/2023   ALBUMIN 3.6 11/01/2023   BILITOT 0.8 11/01/2023   ALKPHOS 80 11/01/2023   AST 18 11/01/2023   ALT 17 11/01/2023   ANIONGAP 11 11/01/2023    Assessment and Plan: * Acute colitis Worsening left-sided lower abdominal pain over the past 1 to 2 days with noted acute colitis on imaging and concurrent sepsis IV Rocephin and Flagyl for infectious coverage Check sed rate and CRP in setting of baseline psoriatic arthritis GI panel and C. difficile studies ordered Pain control Antiemetics IV fluids Monitor  Sepsis (HCC) Meeting sepsis criteria with heart rate 100s, white count of 25 Noted acute colitis on imaging with a left lower quadrant abdominal pain Pancultured IV Rocephin and Flagyl for GI coverage Lactate reassuring Pro-Cal 1.27 LR IV fluid hydration Monitor  Osteoarthritis Right upper extremity subacute fracture as well as chronic osteoarthritis Continue steroids per outpatient recommendations for now Follow  Leukocytosis White count 25 on presentation Suspect secondary to active colitis as well as recent steroid use Continue with treatment for colitis Otherwise trend  Psoriatic arthritis (HCC) Baseline  psoriatic arthritis  Overall stable disease at present  Monitor   AAA (abdominal aortic aneurysm) (HCC) Baseline AAA followed by [facility] Cardiology  06/2022 CTA with stable diameter of 4.1 cm   HTN (hypertension) LLN BP on presentation  Hold BP regimen for now        Advance Care Planning:  Code Status: Full Code   Consults: None   Family Communication: No family at the bedside   Severity of Illness: The appropriate patient status for this patient is INPATIENT. Inpatient status is judged to be reasonable and necessary in order to provide the required intensity of service to ensure the patient's safety. The patient's presenting symptoms, physical exam findings, and initial radiographic and laboratory data in the context of their chronic comorbidities is felt to place them at high risk for further clinical deterioration. Furthermore, it is not anticipated that the patient will be medically stable for discharge from the hospital within 2 midnights of admission.   * I certify that at the point of admission it is my clinical judgment that the patient will require inpatient hospital care spanning beyond 2 midnights from the point of admission due to high intensity of service, high risk for further deterioration and high frequency of surveillance required.*  Author: Floydene Flock, MD 11/02/2023 9:11 AM  For on call review [email or web address].

## 2023-11-02 NOTE — Assessment & Plan Note (Signed)
Meeting sepsis criteria with heart rate 100s, white count of 25 Noted acute colitis on imaging with a left lower quadrant abdominal pain Pancultured IV Rocephin and Flagyl for GI coverage Lactate reassuring Pro-Cal 1.27 LR IV fluid hydration Monitor

## 2023-11-02 NOTE — Assessment & Plan Note (Signed)
Right upper extremity subacute fracture as well as chronic osteoarthritis Continue steroids per outpatient recommendations for now Follow

## 2023-11-02 NOTE — ED Provider Notes (Signed)
[facility] Provider Note    Event Date/Time   First MD Initiated Contact with Patient 11/02/23 [phone]     (approximate)   History   Emesis   HPI  Rachel Mueller is a 62 y.o. female who presents to the ED for evaluation of Emesis   I review a cardiology clinic visit from October.  Known ascending aortic aneurysm.  HTN, HLD.  DM.  No history of intra-abdominal surgeries, has had a tummy tuck and vaginal hysterectomy.  Patient presents alongside her husband for about 24 hours of N/V/D, estimating 10-20 episodes of each emesis and diarrhea.  Nonbloody.  Chills without documented fevers.  No syncope.  No urinary symptoms.  No recent antibiotics, since July.   Physical Exam   Triage Vital Signs: ED Triage Vitals [11/01/23 2056]  Encounter Vitals Group     BP 107/72     Systolic BP Percentile      Diastolic BP Percentile      Pulse Rate (!) 104     Resp 18     Temp 98.3 F (36.8 C)     Temp Source Oral     SpO2 96 %     Weight 162 lb (73.5 kg)     Height 5\' 4"  (1.626 m)     Head Circumference      Peak Flow      Pain Score 5     Pain Loc      Pain Education      Exclude from Growth Chart     Most recent vital signs: Vitals:   11/01/23 2250 11/02/23 0127  BP: 94/69 135/82  Pulse: 94 90  Resp: 18 18  Temp: 98.7 F (37.1 C) 98.2 F (36.8 C)  SpO2: 94% 93%    General: Awake, no distress.  Dry mucous membranes CV:  Good peripheral perfusion.  Resp:  Normal effort.  Abd:  No distention.  LLQ tenderness without peritoneal features or guarding.  Otherwise benign. MSK:  No deformity noted.  Neuro:  No focal deficits appreciated. Other:     ED Results / Procedures / Treatments   Labs (all labs ordered are listed, but only abnormal results are displayed) Labs Reviewed  COMPREHENSIVE METABOLIC PANEL - Abnormal; Notable for the following components:      Result Value   Glucose, Bld 152 (*)    All other components within normal limits   CBC - Abnormal; Notable for the following components:   WBC 25.1 (*)    Platelets 485 (*)    All other components within normal limits  URINALYSIS, ROUTINE W REFLEX MICROSCOPIC - Abnormal; Notable for the following components:   Color, Urine AMBER (*)    APPearance HAZY (*)    Hgb urine dipstick SMALL (*)    Ketones, ur 20 (*)    Leukocytes,Ua MODERATE (*)    Bacteria, UA RARE (*)    All other components within normal limits  CBC WITH DIFFERENTIAL/PLATELET - Abnormal; Notable for the following components:   WBC 24.8 (*)    Platelets 504 (*)    Neutro Abs 21.0 (*)    Monocytes Absolute 2.0 (*)    Abs Immature Granulocytes 0.19 (*)    All other components within normal limits  RESP PANEL BY RT-PCR (RSV, FLU A&B, COVID)  RVPGX2  GASTROINTESTINAL PANEL BY PCR, STOOL (REPLACES STOOL CULTURE)  C DIFFICILE QUICK SCREEN W PCR REFLEX    CULTURE, BLOOD (ROUTINE X 2)  CULTURE, BLOOD (  ROUTINE X 2)  LIPASE, BLOOD  LACTIC ACID, PLASMA  LACTIC ACID, PLASMA  PROCALCITONIN    EKG   RADIOLOGY CT abdomen/pelvis interpreted by me with signs of descending colitis.  Official radiology report(s): CT ABDOMEN PELVIS W CONTRAST  Result Date: 11/02/2023 CLINICAL DATA:  Abdominal pain EXAM: CT ABDOMEN AND PELVIS WITH CONTRAST TECHNIQUE: Multidetector CT imaging of the abdomen and pelvis was performed using the standard protocol following bolus administration of intravenous contrast. RADIATION DOSE REDUCTION: This exam was performed according to the departmental dose-optimization program which includes automated exposure control, adjustment of the mA and/or kV according to patient size and/or use of iterative reconstruction technique. CONTRAST:  OMNIPAQUE IOHEXOL 300 MG/ML  SOLN COMPARISON:  None Available. FINDINGS: Lower Chest: Normal. Hepatobiliary: Normal hepatic contours. No intra- or extrahepatic biliary dilatation. The gallbladder is normal. Pancreas: Normal pancreas. No ductal dilatation or  peripancreatic fluid collection. Spleen: Normal. Adrenals/Urinary Tract: The adrenal glands are normal. No hydronephrosis, nephroureterolithiasis or solid renal mass. The urinary bladder is normal for degree of distention Stomach/Bowel: There is no hiatal hernia. Normal duodenal course and caliber. No small bowel dilatation or inflammation. There is mild inflammatory stranding along the course of the descending colon. There is sigmoid diverticulosis. Normal appendix. Vascular/Lymphatic: There is calcific atherosclerosis of the abdominal aorta. No lymphadenopathy. Reproductive: Status post hysterectomy. No adnexal mass. Other: None. Musculoskeletal: Multilevel degenerative disc disease and facet arthrosis. No bony spinal canal stenosis. IMPRESSION: 1. Mild inflammatory stranding along the course of the descending colon, consistent with acute colitis. Aortic Atherosclerosis (ICD10-I70.0). Electronically Signed   By: Deatra Robinson M.D.   On: 11/02/2023 03:10    PROCEDURES and INTERVENTIONS:  .1-3 Lead EKG Interpretation  Performed by: Delton Prairie, MD Authorized by: Delton Prairie, MD     Interpretation: abnormal     ECG rate:  102   ECG rate assessment: tachycardic     Rhythm: sinus tachycardia     Ectopy: none     Conduction: normal   .Critical Care  Performed by: Delton Prairie, MD Authorized by: Delton Prairie, MD   Critical care provider statement:    Critical care time (minutes):  30   Critical care time was exclusive of:  Separately billable procedures and treating other patients   Critical care was necessary to treat or prevent imminent or life-threatening deterioration of the following conditions:  Sepsis   Critical care was time spent personally by me on the following activities:  Development of treatment plan with patient or surrogate, discussions with consultants, evaluation of patient's response to treatment, examination of patient, ordering and review of laboratory studies, ordering and  review of radiographic studies, ordering and performing treatments and interventions, pulse oximetry, re-evaluation of patient's condition and review of old charts   Medications  lactated ringers infusion (has no administration in time range)  cefTRIAXone (ROCEPHIN) 2 g in sodium chloride 0.9 % 100 mL IVPB (has no administration in time range)  metroNIDAZOLE (FLAGYL) IVPB 500 mg (has no administration in time range)  lactated ringers bolus 1,000 mL (has no administration in time range)    And  lactated ringers bolus 1,000 mL (has no administration in time range)  ondansetron (ZOFRAN-ODT) disintegrating tablet 4 mg (4 mg Oral Given 11/01/23 2059)  iohexol (OMNIPAQUE) 300 MG/ML solution 100 mL (100 mLs Intravenous Contrast Given 11/02/23 0158)     IMPRESSION / MDM / ASSESSMENT AND PLAN / ED COURSE  I reviewed the triage vital signs and the nursing notes.  Differential  diagnosis includes, but is not limited to, C. difficile, viral syndrome, colitis, UTI, pyelonephritis, sepsis, AKI  {Patient presents with symptoms of an acute illness or injury that is potentially life-threatening.  Patient presents with evidence of sepsis from a descending colitis and associated dehydration requiring medical admission.  Tachycardic but hemodynamically stable.  Significant leukocytosis.  No metabolic derangements.  Normal lipase.  Urine with many ketones and moderate leukocytes and we will send for culture.  CT with colitis.  Considering sepsis status we will draw cultures per protocol and provide antibiotics.  Consult medicine for admission.      FINAL CLINICAL IMPRESSION(S) / ED DIAGNOSES   Final diagnoses:  Sepsis, due to unspecified organism, unspecified whether acute organ dysfunction present [facility])  Dehydration  Colitis     Rx / DC Orders   ED Discharge Orders     None        Note:  This document was prepared using Dragon voice recognition software and may include unintentional dictation  errors.   Delton Prairie, MD 11/02/23 [phone]

## 2023-11-02 NOTE — Progress Notes (Signed)
CODE SEPSIS - PHARMACY COMMUNICATION  **Broad Spectrum Antibiotics should be administered within 1 hour of Sepsis diagnosis**  Time Code Sepsis Called/Page Received:  12/6 @ 0440  Antibiotics Ordered: Ceftriaxone   Time of 1st antibiotic administration: Ceftriaxone 2 gm IV X 1 on 12/6 @ 0516.   Additional action taken by pharmacy:   If necessary, Name of Provider/Nurse Contacted:     Thang Flett D ,PharmD Clinical Pharmacist  11/02/2023  5:22 AM

## 2023-11-02 NOTE — Assessment & Plan Note (Signed)
White count 25 on presentation Suspect secondary to active colitis as well as recent steroid use Continue with treatment for colitis Otherwise trend

## 2023-11-02 NOTE — Sepsis Progress Note (Signed)
Elink monitoring for the code sepsis protocol.  

## 2023-11-02 NOTE — Assessment & Plan Note (Signed)
Worsening left-sided lower abdominal pain over the past 1 to 2 days with noted acute colitis on imaging and concurrent sepsis IV Rocephin and Flagyl for infectious coverage Check sed rate and CRP in setting of baseline psoriatic arthritis GI panel and C. difficile studies ordered Pain control Antiemetics IV fluids Monitor

## 2023-11-03 DIAGNOSIS — K529 Noninfective gastroenteritis and colitis, unspecified: Secondary | ICD-10-CM | POA: Diagnosis not present

## 2023-11-03 DIAGNOSIS — I10 Essential (primary) hypertension: Secondary | ICD-10-CM

## 2023-11-03 DIAGNOSIS — M1990 Unspecified osteoarthritis, unspecified site: Secondary | ICD-10-CM | POA: Diagnosis not present

## 2023-11-03 DIAGNOSIS — E785 Hyperlipidemia, unspecified: Secondary | ICD-10-CM

## 2023-11-03 DIAGNOSIS — I714 Abdominal aortic aneurysm, without rupture, unspecified: Secondary | ICD-10-CM | POA: Diagnosis not present

## 2023-11-03 LAB — GLUCOSE, CAPILLARY: Glucose-Capillary: 129 mg/dL — ABNORMAL HIGH (ref 70–99)

## 2023-11-03 LAB — CBC
HCT: 32.9 % — ABNORMAL LOW (ref 36.0–46.0)
Hemoglobin: 11 g/dL — ABNORMAL LOW (ref 12.0–15.0)
MCH: 30.8 pg (ref 26.0–34.0)
MCHC: 33.4 g/dL (ref 30.0–36.0)
MCV: 92.2 fL (ref 80.0–100.0)
Platelets: 323 10*3/uL (ref 150–400)
RBC: 3.57 MIL/uL — ABNORMAL LOW (ref 3.87–5.11)
RDW: 12.5 % (ref 11.5–15.5)
WBC: 12 10*3/uL — ABNORMAL HIGH (ref 4.0–10.5)
nRBC: 0 % (ref 0.0–0.2)

## 2023-11-03 LAB — HEMOGLOBIN AND HEMATOCRIT, BLOOD
HCT: 33.2 % — ABNORMAL LOW (ref 36.0–46.0)
Hemoglobin: 11.1 g/dL — ABNORMAL LOW (ref 12.0–15.0)

## 2023-11-03 LAB — COMPREHENSIVE METABOLIC PANEL
ALT: 12 U/L (ref 0–44)
AST: 14 U/L — ABNORMAL LOW (ref 15–41)
Albumin: 2.9 g/dL — ABNORMAL LOW (ref 3.5–5.0)
Alkaline Phosphatase: 54 U/L (ref 38–126)
Anion gap: 4 — ABNORMAL LOW (ref 5–15)
BUN: 6 mg/dL — ABNORMAL LOW (ref 8–23)
CO2: 26 mmol/L (ref 22–32)
Calcium: 8.2 mg/dL — ABNORMAL LOW (ref 8.9–10.3)
Chloride: 108 mmol/L (ref 98–111)
Creatinine, Ser: 0.5 mg/dL (ref 0.44–1.00)
GFR, Estimated: >60 mL/min (ref >60)
Glucose, Bld: 95 mg/dL (ref 70–99)
Potassium: 3.6 mmol/L (ref 3.5–5.1)
Sodium: 138 mmol/L (ref 135–145)
Total Bilirubin: 0.7 mg/dL (ref <1.2)
Total Protein: 6.3 g/dL — ABNORMAL LOW (ref 6.5–8.1)

## 2023-11-03 LAB — CBG MONITORING, ED
Glucose-Capillary: 104 mg/dL — ABNORMAL HIGH (ref 70–99)
Glucose-Capillary: 127 mg/dL — ABNORMAL HIGH (ref 70–99)

## 2023-11-03 MED ORDER — SODIUM CHLORIDE 0.9 % IV SOLN: INTRAVENOUS | Status: DC

## 2023-11-03 MED ORDER — ATORVASTATIN CALCIUM 20 MG PO TABS
20.0000 mg | ORAL_TABLET | Freq: Every day | ORAL | Status: DC
  Administered 2023-11-03 – 2023-11-05 (×3): 20 mg via ORAL
  Filled 2023-11-03 (×3): qty 1

## 2023-11-03 MED ORDER — PANTOPRAZOLE SODIUM 40 MG PO TBEC
40.0000 mg | DELAYED_RELEASE_TABLET | Freq: Every day | ORAL | Status: DC
  Administered 2023-11-03 – 2023-11-05 (×3): 40 mg via ORAL
  Filled 2023-11-03 (×3): qty 1

## 2023-11-03 MED ORDER — METRONIDAZOLE 500 MG PO TABS
500.0000 mg | ORAL_TABLET | Freq: Three times a day (TID) | ORAL | Status: DC
  Administered 2023-11-03 – 2023-11-05 (×6): 500 mg via ORAL
  Filled 2023-11-03 (×7): qty 1

## 2023-11-03 MED ORDER — IRBESARTAN 150 MG PO TABS
150.0000 mg | ORAL_TABLET | Freq: Every day | ORAL | Status: DC
  Administered 2023-11-03 – 2023-11-05 (×3): 150 mg via ORAL
  Filled 2023-11-03 (×4): qty 1

## 2023-11-03 MED ORDER — AMPHETAMINE-DEXTROAMPHETAMINE 10 MG PO TABS
10.0000 mg | ORAL_TABLET | Freq: Two times a day (BID) | ORAL | Status: DC
  Administered 2023-11-04: 10 mg via ORAL
  Filled 2023-11-03 (×2): qty 1

## 2023-11-03 NOTE — Assessment & Plan Note (Signed)
Continue with statin therapy.  ?

## 2023-11-03 NOTE — ED Notes (Signed)
Report received, care of pt assumed.  Pt resting quietly with eyes closed, respirations even and non labored.  NAD noted at this time, will continue to monitor.

## 2023-11-03 NOTE — Progress Notes (Signed)
Blood glucose was 126 at 2017. Accucheck did not transfer over.

## 2023-11-03 NOTE — Hospital Course (Signed)
Rachel Mueller was admitted to the hospital with the working diagnosis of colitis.   62 yo female with the past medical history of asthma, T2DM, psoriatic arthritis, abdominal aortic aneurysm and hypertension who presented with vomiting and diarrhea. Reported worsening left lower quadrant abdominal pain over the last 24 to 48 hrs prior to admission. Associated with nausea and vomiting, and diarrhea. Recent treatment with steroids for arthritis. On her initial physical examination her blood pressure was 107/72, HR 104, RR 18 and 02 saturation 93%, lungs with no wheezing or rales, heart with S1 and S2 present and regular, abdomen tender to palpation left lower quadrant, no lower extremity edema.  LABS:>> Na 135, K 4,3 Cl 98, bicarbonate 26 glucose 152, bun 10 cr 0,80  Wbc 24,8 hgb 14.3 plt 485  Sars covid 19 negative UA>> SG 1,020, protein negative, moderate leukocytes, small hgb. 21-50 wbc, 6-10 rbc.  Urine culture with 40,000 CFU of E coli.  CT abdomen and pelvis>>mild inflammatory stranding along the course of the descending colon, consistent with acute colitis.  Patient was placed on IV fluids, IV antibiotics and clear liquid diet AND admitted and managed conservatively She is clinically improved, labs showed stable CBC BMP.  Blood culture no growth, urine culture with E. Coli Tolerating soft diet and at this time she feels well and ready for discharge

## 2023-11-03 NOTE — ED Notes (Signed)
Pt ambulatory to bathroom and back to bed without difficulty.

## 2023-11-03 NOTE — Progress Notes (Signed)
  Progress Note   Patient: Rachel Mueller ZOX:[id number] DOB: [birth year] DOA: 11/02/2023     1 DOS: the patient was seen and examined on 11/03/2023   Brief hospital course: Rachel Mueller was admitted to the hospital with the working diagnosis of colitis.   62 yo female with the past medical history of asthma, T2DM, psoriatic arthritis, abdominal aortic aneurysm and hypertension who presented with vomiting and diarrhea. Reported worsening left lower quadrant abdominal pain over the last 24 to 48 hrs prior to admission. Associated with nausea and vomiting, and diarrhea. Recent treatment with steroids for arthritis. On her initial physical examination her blood pressure was 107/72, HR 104, RR 18 and 02 saturation 93%, lungs with no wheezing or rales, heart with S1 and S2 present and regular, abdomen tender to palpation left lower quadrant, no lower extremity edema.   CT abdomen and pelvis with mild inflammatory stranding along the course of the descending colon, consistent with acute colitis.   Patient was placed on IV fluids, IV antibiotics and clear liquid diet.    Assessment and Plan: * Acute colitis Complicated with sepsis, present on admission.   Patient with improvement in her abdominal symptoms, she has been tolerating clear liquids well, with no further nausea or vomiting.  Follow up wbc is 12.0   Plan to continue antibiotic therapy with IV ceftriaxone and oral metronidazole.  Keep K at 4 or greater.  Continue supportive IV fluids, as needed analgesic and antiemetics Pantoprazole.  Advance diet to soft.  Note that patient has history of diverticulosis.   HTN (hypertension) Improving blood pressure, plan to resume ARB in am, continue to hold hydrochlorothiazide for now.  Continue blood pressure monitoring.    Osteoarthritis Right upper extremity subacute fracture as well as chronic osteoarthritis Continue steroids per outpatient recommendations for now   AAA (abdominal  aortic aneurysm) (HCC) Baseline AAA followed by [facility]  06/2022 CTA with stable diameter of 4.1 cm   Psoriatic arthritis (HCC) Baseline psoriatic arthritis  Overall stable disease at present  Monitor   Dyslipidemia Continue with statin therapy.         Subjective: Patient is feeling better, she is tolerating clears well, no nausea or vomiting, abdominal pain has improved but not yet resolved.   Physical Exam: Vitals:   11/03/23 0136 11/03/23 0400 11/03/23 0905 11/03/23 1313  BP: 123/73 109/70 118/71 135/74  Pulse: 76 72 84 92  Resp: 17 18 17  (!) 23  Temp:  98.2 F (36.8 C) (!) 97.2 F (36.2 C) 97.8 F (36.6 C)  TempSrc:  Oral  Oral  SpO2: 94% 95% 98% 97%  Weight:      Height:       Neurology awake and alert ENT with mild pallor Cardiovascular with S1 and S2 present and regular with no gallops, rubs or murmurs Respiratory with no rales or wheezing, no rhonchi Abdomen with no distention, non tender to superficial palpation, soft with no rebound or guarding No lower extremity edema  Data Reviewed:    Family Communication: I spoke with patient's daughter at the bedside, we talked in detail about patient's condition, plan of care and prognosis and all questions were addressed.   Disposition: Status is: Inpatient Remains inpatient appropriate because: IV fluids and IV antibiotics   Planned Discharge Destination: Home      Author: Coralie Keens, MD 11/03/2023 3:22 PM  For on call review [email or web address].

## 2023-11-04 DIAGNOSIS — E876 Hypokalemia: Secondary | ICD-10-CM

## 2023-11-04 DIAGNOSIS — K529 Noninfective gastroenteritis and colitis, unspecified: Secondary | ICD-10-CM | POA: Diagnosis not present

## 2023-11-04 DIAGNOSIS — I714 Abdominal aortic aneurysm, without rupture, unspecified: Secondary | ICD-10-CM | POA: Diagnosis not present

## 2023-11-04 DIAGNOSIS — M1990 Unspecified osteoarthritis, unspecified site: Secondary | ICD-10-CM | POA: Diagnosis not present

## 2023-11-04 DIAGNOSIS — I10 Essential (primary) hypertension: Secondary | ICD-10-CM | POA: Diagnosis not present

## 2023-11-04 LAB — CBC WITH DIFFERENTIAL/PLATELET
Abs Immature Granulocytes: 0.03 10*3/uL (ref 0.00–0.07)
Basophils Absolute: 0.1 10*3/uL (ref 0.0–0.1)
Basophils Relative: 1 %
Eosinophils Absolute: 0.3 10*3/uL (ref 0.0–0.5)
Eosinophils Relative: 3 %
HCT: 33.2 % — ABNORMAL LOW (ref 36.0–46.0)
Hemoglobin: 11.3 g/dL — ABNORMAL LOW (ref 12.0–15.0)
Immature Granulocytes: 0 %
Lymphocytes Relative: 32 %
Lymphs Abs: 2.9 10*3/uL (ref 0.7–4.0)
MCH: 31 pg (ref 26.0–34.0)
MCHC: 34 g/dL (ref 30.0–36.0)
MCV: 91.2 fL (ref 80.0–100.0)
Monocytes Absolute: 0.9 10*3/uL (ref 0.1–1.0)
Monocytes Relative: 10 %
Neutro Abs: 5 10*3/uL (ref 1.7–7.7)
Neutrophils Relative %: 54 %
Platelets: 369 10*3/uL (ref 150–400)
RBC: 3.64 MIL/uL — ABNORMAL LOW (ref 3.87–5.11)
RDW: 12.3 % (ref 11.5–15.5)
WBC: 9.2 10*3/uL (ref 4.0–10.5)
nRBC: 0 % (ref 0.0–0.2)

## 2023-11-04 LAB — GLUCOSE, CAPILLARY
Glucose-Capillary: 102 mg/dL — ABNORMAL HIGH (ref 70–99)
Glucose-Capillary: 87 mg/dL (ref 70–99)
Glucose-Capillary: 160 mg/dL — ABNORMAL HIGH (ref 70–99)
Glucose-Capillary: 154 mg/dL — ABNORMAL HIGH (ref 70–99)

## 2023-11-04 LAB — BASIC METABOLIC PANEL
Anion gap: 9 (ref 5–15)
BUN: 10 mg/dL (ref 8–23)
CO2: 22 mmol/L (ref 22–32)
Calcium: 8.3 mg/dL — ABNORMAL LOW (ref 8.9–10.3)
Chloride: 107 mmol/L (ref 98–111)
Creatinine, Ser: 0.72 mg/dL (ref 0.44–1.00)
GFR, Estimated: >60 mL/min (ref >60)
Glucose, Bld: 102 mg/dL — ABNORMAL HIGH (ref 70–99)
Potassium: 3.2 mmol/L — ABNORMAL LOW (ref 3.5–5.1)
Sodium: 138 mmol/L (ref 135–145)

## 2023-11-04 MED ORDER — MELATONIN 5 MG PO TABS
5.0000 mg | ORAL_TABLET | Freq: Every day | ORAL | Status: DC
  Administered 2023-11-04: 5 mg via ORAL
  Filled 2023-11-04: qty 1

## 2023-11-04 MED ORDER — POTASSIUM CHLORIDE CRYS ER 20 MEQ PO TBCR
40.0000 meq | EXTENDED_RELEASE_TABLET | ORAL | Status: AC
  Administered 2023-11-04 (×2): 40 meq via ORAL
  Filled 2023-11-04 (×2): qty 2

## 2023-11-04 MED ORDER — LORATADINE 10 MG PO TABS
10.0000 mg | ORAL_TABLET | Freq: Every day | ORAL | Status: DC
  Administered 2023-11-04 – 2023-11-05 (×2): 10 mg via ORAL
  Filled 2023-11-04 (×2): qty 1

## 2023-11-04 NOTE — Plan of Care (Signed)

## 2023-11-04 NOTE — Progress Notes (Addendum)
Progress Note   Patient: Rachel Mueller ZOX:[id number] DOB: [birth year] DOA: 11/02/2023     2 DOS: the patient was seen and examined on 11/04/2023   Brief hospital course: Rachel Mueller was admitted to the hospital with the working diagnosis of colitis.   62 yo female with the past medical history of asthma, T2DM, psoriatic arthritis, abdominal aortic aneurysm and hypertension who presented with vomiting and diarrhea. Reported worsening left lower quadrant abdominal pain over the last 24 to 48 hrs prior to admission. Associated with nausea and vomiting, and diarrhea. Recent treatment with steroids for arthritis. On her initial physical examination her blood pressure was 107/72, HR 104, RR 18 and 02 saturation 93%, lungs with no wheezing or rales, heart with S1 and S2 present and regular, abdomen tender to palpation left lower quadrant, no lower extremity edema.   Na 135, K 4,3 Cl 98, bicarbonate 26 glucose 152, bun 10 cr 0,80  Wbc 24,8 hgb 14.3 plt 485  Sars covid 19 negative  Urine analysis SG 1,020, protein negative, moderate leukocytes, small hgb. 21-50 wbc, 6-10 rbc.  Urine culture with 40,000 CFU of E coli.   CT abdomen and pelvis with mild inflammatory stranding along the course of the descending colon, consistent with acute colitis.   Patient was placed on IV fluids, IV antibiotics and clear liquid diet.   12/08 clinically improving, but not yet back to baseline.    Assessment and Plan: * Acute colitis Complicated with sepsis, present on admission.   Symptoms continue to improve and she has been tolerating soft diet.  Wbc is 9,2 and she has been afebrile.  Blood cultures continue with no growth.  Positive flatus but not bowel movement.   Plan to continue antibiotic therapy with IV ceftriaxone and oral metronidazole.  Will discontinue IV fluids and continue to encourage po intake.  Pantoprazole.   Note that patient has history of diverticulosis.   HTN  (hypertension) Continue blood pressure control with irbersartan.    Osteoarthritis Right upper extremity subacute fracture as well as chronic osteoarthritis Continue steroids per outpatient recommendations for now   AAA (abdominal aortic aneurysm) (HCC) Baseline AAA followed by [facility]  06/2022 CTA with stable diameter of 4.1 cm   Psoriatic arthritis (HCC) Baseline psoriatic arthritis  Overall stable disease at present  Monitor   Dyslipidemia Continue with statin therapy.   Hypokalemia Renal function with serum cr at 0,72 with K at 3,2 and serum bicarbonate at 22.  Na 138   Plan to add 40 meq Kcl x 2 doses  Follow up renal function and electrolytes in am Hold on IV fluids.    Subjective: Patient with improvement in abdominal pain, but not back to baseline, no nausea or vomiting, positive flatus but no bowel movement. She is requesting sleeping aid, she had melatonin in the past with good toleration.   Physical Exam: Vitals:   11/03/23 1652 11/03/23 2038 11/04/23 0358 11/04/23 0813  BP: 117/79 119/75 131/82 117/77  Pulse: 79 75 78 70  Resp: 16 16 20    Temp: 98.2 F (36.8 C) 97.9 F (36.6 C) 98 F (36.7 C) 98.2 F (36.8 C)  TempSrc: Oral Oral Oral Oral  SpO2: 96% 96% 94% 95%  Weight:      Height:       Neurology awake and alert ENT with mild pallor Cardiovascular with S1 and S2 present and regular with no gallops, rubs or murmurs Respiratory with no rales or wheezing, no rhonchi Abdomen with mild  distention, non tender to superficial palpation with no rebound or guarding.  No lower extremity edema  Data Reviewed:    Family Communication: no family at the bedside 3  Disposition: Status is: Inpatient Remains inpatient appropriate because: IV antibiotics   Planned Discharge Destination: Home     Author: Coralie Keens, MD 11/04/2023 3:02 PM  For on call review [email or web address].

## 2023-11-04 NOTE — TOC CM/SW Note (Signed)
Transition of Care [facility]) - Inpatient Brief Assessment   Patient Details  Name: Rachel Mueller MRN: [id number] Date of Birth: [birth year]  [facility] CM/SW Contact:    Liliana Cline, LCSW Phone Number: 11/04/2023, 9:06 AM   Transition of Care Asessment: Insurance and Status: Insurance coverage has been reviewed Patient has primary care physician: Yes     Prior/Current Home Services: No current home services Social Determinants of Health Reivew: SDOH reviewed no interventions necessary Readmission risk has been reviewed: Yes Transition of care needs: no transition of care needs at this time

## 2023-11-04 NOTE — Assessment & Plan Note (Signed)
Renal function with serum cr at 0,72 with K at 3,2 and serum bicarbonate at 22.  Na 138   Plan to add 40 meq Kcl x 2 doses  Follow up renal function and electrolytes in am Hold on IV fluids.

## 2023-11-05 DIAGNOSIS — K529 Noninfective gastroenteritis and colitis, unspecified: Secondary | ICD-10-CM | POA: Diagnosis not present

## 2023-11-05 LAB — CBC
HCT: 37.7 % (ref 36.0–46.0)
Hemoglobin: 12.8 g/dL (ref 12.0–15.0)
MCH: 30.5 pg (ref 26.0–34.0)
MCHC: 34 g/dL (ref 30.0–36.0)
MCV: 90 fL (ref 80.0–100.0)
Platelets: 437 10*3/uL — ABNORMAL HIGH (ref 150–400)
RBC: 4.19 MIL/uL (ref 3.87–5.11)
RDW: 12.3 % (ref 11.5–15.5)
WBC: 9.6 10*3/uL (ref 4.0–10.5)
nRBC: 0 % (ref 0.0–0.2)

## 2023-11-05 LAB — BASIC METABOLIC PANEL
Anion gap: 11 (ref 5–15)
BUN: 11 mg/dL (ref 8–23)
CO2: 20 mmol/L — ABNORMAL LOW (ref 22–32)
Calcium: 8.9 mg/dL (ref 8.9–10.3)
Chloride: 107 mmol/L (ref 98–111)
Creatinine, Ser: 0.75 mg/dL (ref 0.44–1.00)
GFR, Estimated: >60 mL/min (ref >60)
Glucose, Bld: 102 mg/dL — ABNORMAL HIGH (ref 70–99)
Potassium: 3.8 mmol/L (ref 3.5–5.1)
Sodium: 138 mmol/L (ref 135–145)

## 2023-11-05 LAB — URINE CULTURE: Culture: 40000 — AB

## 2023-11-05 LAB — GLUCOSE, CAPILLARY
Glucose-Capillary: 98 mg/dL (ref 70–99)
Glucose-Capillary: 109 mg/dL — ABNORMAL HIGH (ref 70–99)

## 2023-11-05 LAB — MAGNESIUM: Magnesium: 1.9 mg/dL (ref 1.7–2.4)

## 2023-11-05 MED ORDER — CEFADROXIL 500 MG PO CAPS: 500.0000 mg | ORAL_CAPSULE | Freq: Two times a day (BID) | ORAL | 0 refills | Status: AC

## 2023-11-05 MED ORDER — METRONIDAZOLE 500 MG PO TABS: 500.0000 mg | ORAL_TABLET | Freq: Three times a day (TID) | ORAL | 0 refills | Status: AC

## 2023-11-05 NOTE — Progress Notes (Signed)
PT Cancellation Note  Patient Details Name: Rachel Mueller MRN: [id number] DOB: [birth year]   Cancelled Treatment:    Reason Eval/Treat Not Completed: PT screened, no needs identified, will sign off. PT went to check on pt, pt independently showering, and per OT reported she is at her baseline, no PT needs.  Olga Coaster PT, DPT 12:18 PM,11/05/23

## 2023-11-05 NOTE — Plan of Care (Signed)
  Problem: Clinical Measurements: Goal: Diagnostic test results will improve Outcome: Not Progressing Goal: Signs and symptoms of infection will decrease Outcome: Not Progressing

## 2023-11-05 NOTE — Discharge Summary (Signed)
Physician Discharge Summary  Rachel Mueller MWN:[id number] DOB: September 20, 1961 DOA: 11/02/2023  PCP: Jerl Mina, MD  Admit date: 11/02/2023 Discharge date: 11/05/2023 Recommendations for Outpatient Follow-up:  Follow up with PCP in 1 weeks-call for appointment Follow-up with your gastroenterologist Please obtain BMP/CBC in one week  Discharge Dispo: Home Discharge Condition: Stable Code Status:   Code Status: Full Code Diet recommendation:  Diet Order             DIET SOFT Room service appropriate? Yes; Fluid consistency: Thin  Diet effective now                    Brief/Interim Summary: Rachel Mueller was admitted to the hospital with the working diagnosis of colitis.   62 yo female with the past medical history of asthma, T2DM, psoriatic arthritis, abdominal aortic aneurysm and hypertension who presented with vomiting and diarrhea. Reported worsening left lower quadrant abdominal pain over the last 24 to 48 hrs prior to admission. Associated with nausea and vomiting, and diarrhea. Recent treatment with steroids for arthritis. On her initial physical examination her blood pressure was 107/72, HR 104, RR 18 and 02 saturation 93%, lungs with no wheezing or rales, heart with S1 and S2 present and regular, abdomen tender to palpation left lower quadrant, no lower extremity edema.  LABS:>> Na 135, K 4,3 Cl 98, bicarbonate 26 glucose 152, bun 10 cr 0,80  Wbc 24,8 hgb 14.3 plt 485  Sars covid 19 negative UA>> SG 1,020, protein negative, moderate leukocytes, small hgb. 21-50 wbc, 6-10 rbc.  Urine culture with 40,000 CFU of E coli.  CT abdomen and pelvis>>mild inflammatory stranding along the course of the descending colon, consistent with acute colitis.  Patient was placed on IV fluids, IV antibiotics and clear liquid diet AND admitted and managed conservatively She is clinically improved, labs showed stable CBC BMP.  Blood culture no growth, urine culture with E. Coli Tolerating soft  diet and at this time she feels well and ready for discharge   Discharge Diagnoses:  Principal Problem:   Acute colitis Active Problems:   HTN (hypertension)   Osteoarthritis   AAA (abdominal aortic aneurysm) (HCC)   Psoriatic arthritis (HCC)   Dyslipidemia   Hypokalemia   * Acute colitis Complicated with sepsis, present on admission.  Symptoms improved no leukocytosis WBC normal blood culture no growth passing flatus Continue antibiotics p.o.stool softener Note that patient has history of diverticulosis.   HTN  Continue blood pressure control with irbersartan.   Osteoarthritis Right upper extremity subacute fracture as well as chronic osteoarthritis. Continue steroids per outpatient recommendations for now  [id number] AAA followed by [facility] ,06/2022 CTA with stable diameter of 4.1 cm   Psoriatic arthritis Baseline psoriatic arthritis  Resume home meds upon discharge and follow-up with rheumatology  Dyslipidemia Continue with statin therapy.   Hypokalemia Resolved  Consults: none Subjective: Alert awake oriented no abdominal pain passing gas  Discharge Exam: Vitals:   11/04/23 2117 11/05/23 0851  BP: 132/81 (!) 134/90  Pulse: 79 74  Resp: 18 16  Temp: 98 F (36.7 C) 97.8 F (36.6 C)  SpO2: 96% 94%   General: Pt is alert, awake, not in acute distress Cardiovascular: RRR, S1/S2 +, no rubs, no gallops Respiratory: CTA bilaterally, no wheezing, no rhonchi Abdominal: Soft, NT, ND, bowel sounds + Extremities: no edema, no cyanosis  Discharge Instructions  Discharge Instructions     Discharge instructions   Complete by: As directed    Please  call call MD or return to ER for similar or worsening recurring problem that brought you to hospital or if any fever,nausea/vomiting,abdominal pain, uncontrolled pain, chest pain,  shortness of breath or any other alarming symptoms.  Please follow-up your doctor as instructed in a week time and call the office  for appointment.  Please avoid alcohol, smoking, or any other illicit substance and maintain healthy habits including taking your regular medications as prescribed.  You were cared for by a hospitalist during your hospital stay. If you have any questions about your discharge medications or the care you received while you were in the hospital after you are discharged, you can call the unit and ask to speak with the hospitalist on call if the hospitalist that took care of you is not available.  Once you are discharged, your primary care physician will handle any further medical issues. Please note that NO REFILLS for any discharge medications will be authorized once you are discharged, as it is imperative that you return to your primary care physician (or establish a relationship with a primary care physician if you do not have one) for your aftercare needs so that they can reassess your need for medications and monitor your lab values   Increase activity slowly   Complete by: As directed       Allergies as of 11/05/2023   No Known Allergies      Medication List     TAKE these medications    amphetamine-dextroamphetamine 10 MG tablet Commonly known as: ADDERALL Take 10 mg by mouth 2 (two) times daily.   atorvastatin 20 MG tablet Commonly known as: LIPITOR Take 20 mg by mouth daily.   cefadroxil 500 MG capsule Commonly known as: DURICEF Take 1 capsule (500 mg total) by mouth 2 (two) times daily for 7 days.   cetirizine 10 MG tablet Commonly known as: ZYRTEC Take 10 mg by mouth daily.   cholecalciferol 25 MCG (1000 UNIT) tablet Commonly known as: VITAMIN D3 Take 1,000 Units by mouth daily.   Co Q 10 100 MG Caps Take 1 capsule by mouth as directed.   Fezolinetant 45 MG Tabs Take 45 mg by mouth daily.   irbesartan-hydrochlorothiazide 150-12.5 MG tablet Commonly known as: AVALIDE Take 1 tablet by mouth daily.   Magnesium 400 MG Tabs Take 400 mg by mouth daily.    metFORMIN 500 MG 24 hr tablet Commonly known as: GLUCOPHAGE-XR Take 1,000 mg by mouth every evening.   metroNIDAZOLE 500 MG tablet Commonly known as: FLAGYL Take 1 tablet (500 mg total) by mouth every 8 (eight) hours for 7 days.   multivitamin tablet Take 1 tablet by mouth daily.   pantoprazole 40 MG tablet Commonly known as: PROTONIX Take 40 mg by mouth daily.   predniSONE 2.5 MG tablet Commonly known as: DELTASONE Take 2.5 mg by mouth daily with breakfast.   Semaglutide (2 MG/DOSE) 8 MG/3ML Sopn Inject 2 mg into the skin every Sunday.        Follow-up Information     Jerl Mina, MD Follow up in 1 week(s).   Specialty: Family Medicine Contact information: [address] [facility] [place] [address] [phone]                No Known Allergies  The results of significant diagnostics from this hospitalization (including imaging, microbiology, ancillary and laboratory) are listed below for reference.    Microbiology: Recent Results (from the past 240 hour(s))  Resp panel by  RT-PCR (RSV, Flu A&B, Covid) Anterior Nasal Swab     Status: None   Collection Time: 11/01/23  9:00 PM   Specimen: Anterior Nasal Swab  Result Value Ref Range Status   SARS Coronavirus 2 by RT PCR NEGATIVE NEGATIVE Final    Comment: (NOTE) SARS-CoV-2 target nucleic acids are NOT DETECTED.  The SARS-CoV-2 RNA is generally detectable in upper respiratory specimens during the acute phase of infection. The lowest concentration of SARS-CoV-2 viral copies this assay can detect is 138 copies/mL. A negative result does not preclude SARS-Cov-2 infection and should not be used as the sole basis for treatment or other patient management decisions. A negative result may occur with  improper specimen collection/handling, submission of specimen other than nasopharyngeal swab, presence of viral mutation(s) within the areas targeted by this assay, and inadequate number of  viral copies(<138 copies/mL). A negative result must be combined with clinical observations, patient history, and epidemiological information. The expected result is Negative.  Fact Sheet for Patients:  BloggerCourse.com  Fact Sheet for Healthcare Providers:  SeriousBroker.it  This test is no t yet approved or cleared by the [place] FDA and  has been authorized for detection and/or diagnosis of SARS-CoV-2 by FDA under an Emergency Use Authorization (EUA). This EUA will remain  in effect (meaning this test can be used) for the duration of the COVID-19 declaration under Section 564(b)(1) of the Act, 21 U.S.C.section 360bbb-3(b)(1), unless the authorization is terminated  or revoked sooner.       Influenza A by PCR NEGATIVE NEGATIVE Final   Influenza B by PCR NEGATIVE NEGATIVE Final    Comment: (NOTE) The Xpert Xpress SARS-CoV-2/FLU/RSV plus assay is intended as an aid in the diagnosis of influenza from Nasopharyngeal swab specimens and should not be used as a sole basis for treatment. Nasal washings and aspirates are unacceptable for Xpert Xpress SARS-CoV-2/FLU/RSV testing.  Fact Sheet for Patients: BloggerCourse.com  Fact Sheet for Healthcare Providers: SeriousBroker.it  This test is not yet approved or cleared by the [place] FDA and has been authorized for detection and/or diagnosis of SARS-CoV-2 by FDA under an Emergency Use Authorization (EUA). This EUA will remain in effect (meaning this test can be used) for the duration of the COVID-19 declaration under Section 564(b)(1) of the Act, 21 U.S.C. section 360bbb-3(b)(1), unless the authorization is terminated or revoked.     Resp Syncytial Virus by PCR NEGATIVE NEGATIVE Final    Comment: (NOTE) Fact Sheet for Patients: BloggerCourse.com  Fact Sheet for Healthcare  Providers: SeriousBroker.it  This test is not yet approved or cleared by the [place] FDA and has been authorized for detection and/or diagnosis of SARS-CoV-2 by FDA under an Emergency Use Authorization (EUA). This EUA will remain in effect (meaning this test can be used) for the duration of the COVID-19 declaration under Section 564(b)(1) of the Act, 21 U.S.C. section 360bbb-3(b)(1), unless the authorization is terminated or revoked.  Performed at [facility], [address], [place], [place] [address]   Urine Culture     Status: Abnormal   Collection Time: 11/01/23  9:00 PM   Specimen: Urine, Clean Catch  Result Value Ref Range Status   Specimen Description   Final    URINE, CLEAN CATCH Performed at [facility], [address] [place], [place] [address]    Special Requests   Final    NONE Performed at [facility], [address], [place], [place] [address]    Culture 40,000 COLONIES/mL  ESCHERICHIA COLI (A)  Final   Report Status 11/05/2023 FINAL  Final   Organism ID, Bacteria ESCHERICHIA COLI (A)  Final      Susceptibility   Escherichia coli - MIC*    AMPICILLIN >=32 RESISTANT Resistant     CEFAZOLIN <=4 SENSITIVE Sensitive     CEFEPIME <=0.12 SENSITIVE Sensitive     CEFTRIAXONE <=0.25 SENSITIVE Sensitive     CIPROFLOXACIN <=0.25 SENSITIVE Sensitive     GENTAMICIN <=1 SENSITIVE Sensitive     IMIPENEM <=0.25 SENSITIVE Sensitive     NITROFURANTOIN <=16 SENSITIVE Sensitive     TRIMETH/SULFA <=20 SENSITIVE Sensitive     AMPICILLIN/SULBACTAM 8 SENSITIVE Sensitive     PIP/TAZO <=4 SENSITIVE Sensitive ug/mL    * 40,000 COLONIES/mL ESCHERICHIA COLI  Blood culture (routine x 2)     Status: None (Preliminary result)   Collection Time: 11/02/23  5:00 AM   Specimen: BLOOD  Result Value Ref Range Status   Specimen Description BLOOD LOWER FA  Final   Special Requests   Final    BOTTLES DRAWN AEROBIC AND ANAEROBIC  Blood Culture results may not be optimal due to an inadequate volume of blood received in culture bottles   Culture   Final    NO GROWTH 3 DAYS Performed at [facility] [address] [place], [place] [address]    Report Status PENDING  Incomplete  Blood culture (routine x 2)     Status: None (Preliminary result)   Collection Time: 11/02/23  5:14 AM   Specimen: BLOOD  Result Value Ref Range Status   Specimen Description BLOOD UPPER RFA  Final   Special Requests   Final    BOTTLES DRAWN AEROBIC AND ANAEROBIC Blood Culture results may not be optimal due to an inadequate volume of blood received in culture bottles   Culture   Final    NO GROWTH 3 DAYS Performed at [facility], [address] [place], [place] [address]    Report Status PENDING  Incomplete    Procedures/Studies: CT ABDOMEN PELVIS W CONTRAST  Result Date: 11/02/2023 CLINICAL DATA:  Abdominal pain EXAM: CT ABDOMEN AND PELVIS WITH CONTRAST TECHNIQUE: Multidetector CT imaging of the abdomen and pelvis was performed using the standard protocol following bolus administration of intravenous contrast. RADIATION DOSE REDUCTION: This exam was performed according to the departmental dose-optimization program which includes automated exposure control, adjustment of the mA and/or kV according to patient size and/or use of iterative reconstruction technique. CONTRAST:  OMNIPAQUE IOHEXOL 300 MG/ML  SOLN COMPARISON:  None Available. FINDINGS: Lower Chest: Normal. Hepatobiliary: Normal hepatic contours. No intra- or extrahepatic biliary dilatation. The gallbladder is normal. Pancreas: Normal pancreas. No ductal dilatation or peripancreatic fluid collection. Spleen: Normal. Adrenals/Urinary Tract: The adrenal glands are normal. No hydronephrosis, nephroureterolithiasis or solid renal mass. The urinary bladder is normal for degree of distention Stomach/Bowel: There is no hiatal hernia. Normal duodenal course and caliber.  No small bowel dilatation or inflammation. There is mild inflammatory stranding along the course of the descending colon. There is sigmoid diverticulosis. Normal appendix. Vascular/Lymphatic: There is calcific atherosclerosis of the abdominal aorta. No lymphadenopathy. Reproductive: Status post hysterectomy. No adnexal mass. Other: None. Musculoskeletal: Multilevel degenerative disc disease and facet arthrosis. No bony spinal canal stenosis. IMPRESSION: 1. Mild inflammatory stranding along the course of the descending colon, consistent with acute colitis. Aortic Atherosclerosis (ICD10-I70.0). Electronically Signed   By: Deatra Robinson M.D.   On: 11/02/2023 03:10    Labs: BNP (last  3 results) No results for input(s): "BNP" in the last 8760 hours. Basic Metabolic Panel: Recent Labs  Lab 11/01/23 2059 11/03/23 0408 11/04/23 0453 11/05/23 0543  NA 135 138 138 138  K 4.3 3.6 3.2* 3.8  CL 98 108 107 107  CO2 26 26 22  20*  GLUCOSE 152* 95 102* 102*  BUN 10 6* 10 11  CREATININE 0.80 0.50 0.72 0.75  CALCIUM 9.1 8.2* 8.3* 8.9  MG  --   --   --  1.9   Liver Function Tests: Recent Labs  Lab 11/01/23 2059 11/03/23 0408  AST 18 14*  ALT 17 12  ALKPHOS 80 54  BILITOT 0.8 0.7  PROT 7.9 6.3*  ALBUMIN 3.6 2.9*   Recent Labs  Lab 11/01/23 2059  LIPASE 21   No results for input(s): "AMMONIA" in the last 168 hours. CBC: Recent Labs  Lab 11/01/23 2059 11/02/23 1416 11/02/23 2146 11/03/23 0408 11/04/23 0453 11/05/23 0543  WBC 24.8*  25.1*  --   --  12.0* 9.2 9.6  NEUTROABS 21.0*  --   --   --  5.0  --   HGB 14.3  14.3 12.6 11.4* 11.0*  11.1* 11.3* 12.8  HCT 43.2  42.6 37.1 34.3* 32.9*  33.2* 33.2* 37.7  MCV 94.5  92.4  --   --  92.2 91.2 90.0  PLT 504*  485*  --   --  323 369 437*      Component Value Date/Time   COLORURINE AMBER (A) 11/01/2023 2100   APPEARANCEUR HAZY (A) 11/01/2023 2100   LABSPEC 1.020 11/01/2023 2100   PHURINE 5.0 11/01/2023 2100   GLUCOSEU NEGATIVE  11/01/2023 2100   HGBUR SMALL (A) 11/01/2023 2100   BILIRUBINUR NEGATIVE 11/01/2023 2100   KETONESUR 20 (A) 11/01/2023 2100   PROTEINUR NEGATIVE 11/01/2023 2100   NITRITE NEGATIVE 11/01/2023 2100   LEUKOCYTESUR MODERATE (A) 11/01/2023 2100   Sepsis Labs Recent Labs  Lab 11/01/23 2059 11/03/23 0408 11/04/23 0453 11/05/23 0543  WBC 24.8*  25.1* 12.0* 9.2 9.6   Microbiology Recent Results (from the past 240 hour(s))  Resp panel by RT-PCR (RSV, Flu A&B, Covid) Anterior Nasal Swab     Status: None   Collection Time: 11/01/23  9:00 PM   Specimen: Anterior Nasal Swab  Result Value Ref Range Status   SARS Coronavirus 2 by RT PCR NEGATIVE NEGATIVE Final    Comment: (NOTE) SARS-CoV-2 target nucleic acids are NOT DETECTED.  The SARS-CoV-2 RNA is generally detectable in upper respiratory specimens during the acute phase of infection. The lowest concentration of SARS-CoV-2 viral copies this assay can detect is 138 copies/mL. A negative result does not preclude SARS-Cov-2 infection and should not be used as the sole basis for treatment or other patient management decisions. A negative result may occur with  improper specimen collection/handling, submission of specimen other than nasopharyngeal swab, presence of viral mutation(s) within the areas targeted by this assay, and inadequate number of viral copies(<138 copies/mL). A negative result must be combined with clinical observations, patient history, and epidemiological information. The expected result is Negative.  Fact Sheet for Patients:  BloggerCourse.com  Fact Sheet for Healthcare Providers:  SeriousBroker.it  This test is no t yet approved or cleared by the [place] FDA and  has been authorized for detection and/or diagnosis of SARS-CoV-2 by FDA under an Emergency Use Authorization (EUA). This EUA will remain  in effect (meaning this test can be used) for the duration of  the COVID-19 declaration  under Section 564(b)(1) of the Act, 21 U.S.C.section 360bbb-3(b)(1), unless the authorization is terminated  or revoked sooner.       Influenza A by PCR NEGATIVE NEGATIVE Final   Influenza B by PCR NEGATIVE NEGATIVE Final    Comment: (NOTE) The Xpert Xpress SARS-CoV-2/FLU/RSV plus assay is intended as an aid in the diagnosis of influenza from Nasopharyngeal swab specimens and should not be used as a sole basis for treatment. Nasal washings and aspirates are unacceptable for Xpert Xpress SARS-CoV-2/FLU/RSV testing.  Fact Sheet for Patients: BloggerCourse.com  Fact Sheet for Healthcare Providers: SeriousBroker.it  This test is not yet approved or cleared by the [place] FDA and has been authorized for detection and/or diagnosis of SARS-CoV-2 by FDA under an Emergency Use Authorization (EUA). This EUA will remain in effect (meaning this test can be used) for the duration of the COVID-19 declaration under Section 564(b)(1) of the Act, 21 U.S.C. section 360bbb-3(b)(1), unless the authorization is terminated or revoked.     Resp Syncytial Virus by PCR NEGATIVE NEGATIVE Final    Comment: (NOTE) Fact Sheet for Patients: BloggerCourse.com  Fact Sheet for Healthcare Providers: SeriousBroker.it  This test is not yet approved or cleared by the [place] FDA and has been authorized for detection and/or diagnosis of SARS-CoV-2 by FDA under an Emergency Use Authorization (EUA). This EUA will remain in effect (meaning this test can be used) for the duration of the COVID-19 declaration under Section 564(b)(1) of the Act, 21 U.S.C. section 360bbb-3(b)(1), unless the authorization is terminated or revoked.  Performed at [facility], [address] [place], [place] [address]   Urine Culture     Status: Abnormal   Collection Time: 11/01/23  9:00  PM   Specimen: Urine, Clean Catch  Result Value Ref Range Status   Specimen Description   Final    URINE, CLEAN CATCH Performed at [facility], [address], [place], [place] [address]    Special Requests   Final    NONE Performed at [facility], [address] [place], [place] [address]    Culture 40,000 COLONIES/mL ESCHERICHIA COLI (A)  Final   Report Status 11/05/2023 FINAL  Final   Organism ID, Bacteria ESCHERICHIA COLI (A)  Final      Susceptibility   Escherichia coli - MIC*    AMPICILLIN >=32 RESISTANT Resistant     CEFAZOLIN <=4 SENSITIVE Sensitive     CEFEPIME <=0.12 SENSITIVE Sensitive     CEFTRIAXONE <=0.25 SENSITIVE Sensitive     CIPROFLOXACIN <=0.25 SENSITIVE Sensitive     GENTAMICIN <=1 SENSITIVE Sensitive     IMIPENEM <=0.25 SENSITIVE Sensitive     NITROFURANTOIN <=16 SENSITIVE Sensitive     TRIMETH/SULFA <=20 SENSITIVE Sensitive     AMPICILLIN/SULBACTAM 8 SENSITIVE Sensitive     PIP/TAZO <=4 SENSITIVE Sensitive ug/mL    * 40,000 COLONIES/mL ESCHERICHIA COLI  Blood culture (routine x 2)     Status: None (Preliminary result)   Collection Time: 11/02/23  5:00 AM   Specimen: BLOOD  Result Value Ref Range Status   Specimen Description BLOOD LOWER FA  Final   Special Requests   Final    BOTTLES DRAWN AEROBIC AND ANAEROBIC Blood Culture results may not be optimal due to an inadequate volume of blood received in culture bottles   Culture   Final    NO GROWTH 3 DAYS Performed at [facility], [address] [place], [place] [address]    Report Status PENDING  Incomplete  Blood culture (routine x 2)     Status: None (Preliminary result)   Collection Time: 11/02/23  5:14 AM   Specimen: BLOOD  Result Value Ref Range Status   Specimen Description BLOOD UPPER RFA  Final   Special Requests   Final    BOTTLES DRAWN AEROBIC AND ANAEROBIC Blood Culture results may not be optimal due to an inadequate volume of blood received in culture  bottles   Culture   Final    NO GROWTH 3 DAYS Performed at [facility], [address], [place], [place] [address]    Report Status PENDING  Incomplete   Time coordinating discharge: 25 minutes  SIGNED: Lanae Boast, MD  [facility] 11/05/2023, 12:01 PM  If 7PM-7AM, please contact night-coverage [email or web address]

## 2023-11-05 NOTE — Progress Notes (Signed)
OT Cancellation Note  Patient Details Name: Rachel Mueller MRN: [id number] DOB: 1961-08-06   Cancelled Treatment:    Reason Eval/Treat Not Completed: (P) OT screened, no needs identified, will sign off. Order received, chart reviewed. Pt reports Independence with mobility and ADL's, RN verified. No skilled OT needs identified. Will sign off. Please reconsult if new needs arise.  Butch Penny, SOT

## 2023-11-05 NOTE — Plan of Care (Signed)
  Problem: Coping: Goal: Ability to adjust to condition or change in health will improve Outcome: Progressing   Problem: Metabolic: Goal: Ability to maintain appropriate glucose levels will improve Outcome: Progressing   Problem: Nutritional: Goal: Maintenance of adequate nutrition will improve Outcome: Progressing   Problem: Skin Integrity: Goal: Risk for impaired skin integrity will decrease Outcome: Progressing   Problem: Education: Goal: Knowledge of General Education information will improve Description: Including pain rating scale, medication(s)/side effects and non-pharmacologic comfort measures Outcome: Progressing

## 2023-11-07 LAB — CULTURE, BLOOD (ROUTINE X 2)
Culture: NO GROWTH
Culture: NO GROWTH

## 2023-12-04 ENCOUNTER — Other Ambulatory Visit: Payer: Self-pay | Admitting: Family Medicine

## 2023-12-04 DIAGNOSIS — Z0000 Encounter for general adult medical examination without abnormal findings: Secondary | ICD-10-CM

## 2023-12-13 ENCOUNTER — Ambulatory Visit
Admission: RE | Admit: 2023-12-13 | Discharge: 2023-12-13 | Disposition: A | Discharge status: Home / Self Care | Payer: BC Managed Care – PPO | Source: Ambulatory Visit | Attending: Family Medicine | Admitting: Family Medicine

## 2023-12-13 DIAGNOSIS — Z0000 Encounter for general adult medical examination without abnormal findings: Secondary | ICD-10-CM

## 2024-12-31 ENCOUNTER — Other Ambulatory Visit: Payer: Self-pay | Admitting: Family Medicine

## 2024-12-31 DIAGNOSIS — Z1231 Encounter for screening mammogram for malignant neoplasm of breast: Secondary | ICD-10-CM

## 2025-01-13 ENCOUNTER — Ambulatory Visit: Payer: Self-pay
